# Patient Record
Sex: Male | Born: 1970 | Race: White | Hispanic: No | Marital: Single | Smoking: Never smoker
Health system: Southern US, Community
[De-identification: ages and names within clinical notes are randomized; demographics above are authoritative.]

## PROBLEM LIST (undated history)

## (undated) ENCOUNTER — Emergency Department: Payer: MEDICAID

## (undated) DIAGNOSIS — F1010 Alcohol abuse, uncomplicated: Secondary | ICD-10-CM

## (undated) DIAGNOSIS — I10 Essential (primary) hypertension: Secondary | ICD-10-CM

## (undated) DIAGNOSIS — F1020 Alcohol dependence, uncomplicated: Secondary | ICD-10-CM

## (undated) MED ORDER — CHLORDIAZEPOXIDE HCL 25 MG OR CAPS
100.0000 mg | ORAL_CAPSULE | ORAL | Status: AC | PRN
Start: 2020-10-17 — End: ?

## (undated) MED ORDER — CHLORDIAZEPOXIDE HCL 25 MG OR CAPS
50.0000 mg | ORAL_CAPSULE | ORAL | Status: AC | PRN
Start: 2020-10-17 — End: ?

---

## 1998-06-15 ENCOUNTER — Emergency Department (HOSPITAL_COMMUNITY): Admission: EM | Admit: 1998-06-15 | Discharge: 1998-06-15 | Payer: Self-pay | Admitting: Emergency Medicine

## 2005-03-15 ENCOUNTER — Emergency Department (HOSPITAL_COMMUNITY): Admission: EM | Admit: 2005-03-15 | Discharge: 2005-03-15 | Payer: Self-pay | Admitting: Emergency Medicine

## 2005-03-21 ENCOUNTER — Emergency Department (HOSPITAL_COMMUNITY): Admission: EM | Admit: 2005-03-21 | Discharge: 2005-03-21 | Payer: Self-pay | Admitting: Emergency Medicine

## 2005-06-29 ENCOUNTER — Emergency Department (HOSPITAL_COMMUNITY): Admission: EM | Admit: 2005-06-29 | Discharge: 2005-06-29 | Payer: Self-pay | Admitting: Emergency Medicine
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
## 2017-12-10 ENCOUNTER — Encounter (HOSPITAL_COMMUNITY): Payer: Self-pay | Admitting: Emergency Medicine

## 2017-12-10 ENCOUNTER — Other Ambulatory Visit: Payer: Self-pay

## 2017-12-10 ENCOUNTER — Emergency Department (HOSPITAL_COMMUNITY)
Admission: EM | Admit: 2017-12-10 | Discharge: 2017-12-10 | Disposition: A | Discharge status: Home / Self Care | Payer: Self-pay | Attending: Emergency Medicine | Admitting: Emergency Medicine

## 2017-12-10 DIAGNOSIS — F10920 Alcohol use, unspecified with intoxication, uncomplicated: Secondary | ICD-10-CM

## 2017-12-10 DIAGNOSIS — F1020 Alcohol dependence, uncomplicated: Secondary | ICD-10-CM | POA: Insufficient documentation

## 2017-12-10 LAB — CBC WITH DIFFERENTIAL/PLATELET
Basophils Absolute: 0.1 10*3/uL (ref 0.0–0.1)
Basophils Relative: 2 %
Eosinophils Absolute: 0.1 10*3/uL (ref 0.0–0.7)
Eosinophils Relative: 2 %
HCT: 42.4 % (ref 39.0–52.0)
Hemoglobin: 14.1 g/dL (ref 13.0–17.0)
LYMPHS ABS: 1.7 10*3/uL (ref 0.7–4.0)
LYMPHS PCT: 39 %
MCH: 33.4 pg (ref 26.0–34.0)
MCHC: 33.3 g/dL (ref 30.0–36.0)
MCV: 100.5 fL — ABNORMAL HIGH (ref 78.0–100.0)
Monocytes Absolute: 0.7 10*3/uL (ref 0.1–1.0)
Monocytes Relative: 17 %
Neutro Abs: 1.7 10*3/uL (ref 1.7–7.7)
Neutrophils Relative %: 40 %
Platelets: 238 10*3/uL (ref 150–400)
RBC: 4.22 MIL/uL (ref 4.22–5.81)
RDW: 16.7 % — ABNORMAL HIGH (ref 11.5–15.5)
WBC: 4.3 10*3/uL (ref 4.0–10.5)

## 2017-12-10 LAB — ACETAMINOPHEN LEVEL: Acetaminophen (Tylenol), Serum: <10 ug/mL — ABNORMAL LOW (ref 10–30)

## 2017-12-10 LAB — BASIC METABOLIC PANEL
Anion gap: 10 (ref 5–15)
BUN: 10 mg/dL (ref 6–20)
CO2: 24 mmol/L (ref 22–32)
Calcium: 8.4 mg/dL — ABNORMAL LOW (ref 8.9–10.3)
Chloride: 107 mmol/L (ref 98–111)
Creatinine, Ser: 1.14 mg/dL (ref 0.61–1.24)
GFR calc Af Amer: >60 mL/min (ref >60)
GFR calc non Af Amer: >60 mL/min (ref >60)
Glucose, Bld: 103 mg/dL — ABNORMAL HIGH (ref 70–99)
Potassium: 4.4 mmol/L (ref 3.5–5.1)
Sodium: 141 mmol/L (ref 135–145)

## 2017-12-10 LAB — ETHANOL: Alcohol, Ethyl (B): 261 mg/dL — ABNORMAL HIGH (ref <10)

## 2017-12-10 LAB — SALICYLATE LEVEL: Salicylate Lvl: <7 mg/dL (ref 2.8–30.0)

## 2017-12-10 MED ORDER — CHLORDIAZEPOXIDE HCL 25 MG PO CAPS: ORAL_CAPSULE | ORAL | 0 refills | Status: DC

## 2017-12-10 MED ORDER — LORAZEPAM 1 MG PO TABS
0.0000 mg | ORAL_TABLET | Freq: Four times a day (QID) | ORAL | Status: DC
  Filled 2017-12-10: qty 1

## 2017-12-10 MED ORDER — LORAZEPAM 2 MG/ML IJ SOLN: 0.0000 mg | Freq: Two times a day (BID) | INTRAMUSCULAR | Status: DC

## 2017-12-10 MED ORDER — LORAZEPAM 1 MG PO TABS: 0.0000 mg | ORAL_TABLET | Freq: Two times a day (BID) | ORAL | Status: DC

## 2017-12-10 MED ORDER — CHLORDIAZEPOXIDE HCL 25 MG PO CAPS
25.0000 mg | ORAL_CAPSULE | Freq: Once | ORAL | Status: AC
  Administered 2017-12-10: 25 mg via ORAL
  Filled 2017-12-10: qty 1

## 2017-12-10 MED ORDER — LORAZEPAM 2 MG/ML IJ SOLN
0.0000 mg | Freq: Four times a day (QID) | INTRAMUSCULAR | Status: DC
  Administered 2017-12-10: 1 mg via INTRAVENOUS

## 2017-12-10 MED ORDER — THIAMINE HCL 100 MG/ML IJ SOLN: 100.0000 mg | Freq: Every day | INTRAMUSCULAR | Status: DC

## 2017-12-10 MED ORDER — VITAMIN B-1 100 MG PO TABS
100.0000 mg | ORAL_TABLET | Freq: Every day | ORAL | Status: DC
  Administered 2017-12-10: 100 mg via ORAL
  Filled 2017-12-10: qty 1
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
## 2017-12-10 NOTE — B[facility] Counselor (Signed)
Clinician spoke to Dr. Eulis Foster and noted the pt was seeking detox. Clinician expressed that [facility] does not do detox, the pt has to have co-occurring disorders to be admitted. Clinician spoke to [place], [place] and noted she expressed to the pt that [facility] does not do detox. Clinician met with the pt and expressed that [facility] does not do detox and provided the pt a list of resources to follow up. The pt seemed upset as he was not getting the help he needed.  After talking with the pt, clinician touched basis with Dr. Eulis Foster and Ria Comment, RN.   Vertell Novak, MS, [facility], [facility] Triage Specialist [phone]

## 2017-12-10 NOTE — ED Triage Notes (Signed)
Pt states he is in need of detox. Pt reports he has not drank for 20 years then began drinking heavily over the last 3 months due to a breakup. Pt reports he has to get up in the middle of the night to drink to stop sweating and shaking.

## 2017-12-10 NOTE — Discharge Instructions (Addendum)
Follow-up for treatment in 1 of the facilities recommended tonight.  Avoid all forms of alcohol.  Use the medication prescribed, Librium, to help you avoid alcohol use.

## 2018-07-24 ENCOUNTER — Other Ambulatory Visit: Payer: Self-pay

## 2018-07-24 ENCOUNTER — Emergency Department (HOSPITAL_COMMUNITY)
Admission: EM | Admit: 2018-07-24 | Discharge: 2018-07-24 | Disposition: A | Discharge status: Home / Self Care | Payer: Medicaid - Out of State | Attending: Emergency Medicine | Admitting: Emergency Medicine

## 2018-07-24 DIAGNOSIS — F10920 Alcohol use, unspecified with intoxication, uncomplicated: Secondary | ICD-10-CM

## 2018-07-24 DIAGNOSIS — F10929 Alcohol use, unspecified with intoxication, unspecified: Secondary | ICD-10-CM | POA: Diagnosis present

## 2018-07-24 DIAGNOSIS — Z79899 Other long term (current) drug therapy: Secondary | ICD-10-CM | POA: Insufficient documentation

## 2018-07-24 DIAGNOSIS — Y909 Presence of alcohol in blood, level not specified: Secondary | ICD-10-CM | POA: Diagnosis not present

## 2018-07-24 LAB — CBC WITH DIFFERENTIAL/PLATELET
Abs Immature Granulocytes: 0.02 10*3/uL (ref 0.00–0.07)
Basophils Absolute: 0.1 10*3/uL (ref 0.0–0.1)
Basophils Relative: 1 %
Eosinophils Absolute: 0 10*3/uL (ref 0.0–0.5)
Eosinophils Relative: 1 %
HCT: 42.5 % (ref 39.0–52.0)
Hemoglobin: 14.1 g/dL (ref 13.0–17.0)
Immature Granulocytes: 1 %
Lymphocytes Relative: 42 %
Lymphs Abs: 1.7 10*3/uL (ref 0.7–4.0)
MCH: 34.3 pg — AB (ref 26.0–34.0)
MCHC: 33.2 g/dL (ref 30.0–36.0)
MCV: 103.4 fL — ABNORMAL HIGH (ref 80.0–100.0)
MONO ABS: 0.5 10*3/uL (ref 0.1–1.0)
MONOS PCT: 11 %
Neutro Abs: 1.8 10*3/uL (ref 1.7–7.7)
Neutrophils Relative %: 44 %
Platelets: 297 10*3/uL (ref 150–400)
RBC: 4.11 MIL/uL — ABNORMAL LOW (ref 4.22–5.81)
RDW: 14.4 % (ref 11.5–15.5)
WBC: 4.1 10*3/uL (ref 4.0–10.5)
nRBC: 0 % (ref 0.0–0.2)

## 2018-07-24 LAB — COMPREHENSIVE METABOLIC PANEL
ALT: 23 U/L (ref 0–44)
AST: 34 U/L (ref 15–41)
Albumin: 4.1 g/dL (ref 3.5–5.0)
Alkaline Phosphatase: 62 U/L (ref 38–126)
Anion gap: 10 (ref 5–15)
BUN: 7 mg/dL (ref 6–20)
CO2: 25 mmol/L (ref 22–32)
Calcium: 8.6 mg/dL — ABNORMAL LOW (ref 8.9–10.3)
Chloride: 111 mmol/L (ref 98–111)
Creatinine, Ser: 0.73 mg/dL (ref 0.61–1.24)
GFR calc Af Amer: >60 mL/min (ref >60)
GFR calc non Af Amer: >60 mL/min (ref >60)
GLUCOSE: 101 mg/dL — AB (ref 70–99)
Potassium: 3.7 mmol/L (ref 3.5–5.1)
SODIUM: 146 mmol/L — AB (ref 135–145)
Total Bilirubin: 0.4 mg/dL (ref 0.3–1.2)
Total Protein: 7.1 g/dL (ref 6.5–8.1)

## 2018-07-24 LAB — ETHANOL: Alcohol, Ethyl (B): 358 mg/dL (ref <10)

## 2018-07-24 NOTE — ED Notes (Signed)
Bed: [facility] Expected date:  Expected time:  Means of arrival:  Comments: 48 yo ETOH

## 2018-07-24 NOTE — ED Triage Notes (Signed)
Pt [place]. Pt reported "having a bad day, wanted to drink it off" Denies pain, n/v.

## 2018-07-24 NOTE — ED Notes (Signed)
Pt states that he is ready to go home.  
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
## 2018-07-24 NOTE — ED Notes (Signed)
Pt tearful upon assessment.  When asked why he was sad, pt only stated "my baby."

## 2018-07-24 NOTE — ED Notes (Signed)
Pt denies SI/HI at this time.  

## 2018-10-04 ENCOUNTER — Encounter (HOSPITAL_COMMUNITY): Payer: Self-pay

## 2018-10-04 ENCOUNTER — Emergency Department (HOSPITAL_COMMUNITY)
Admission: EM | Admit: 2018-10-04 | Discharge: 2018-10-04 | Disposition: A | Discharge status: Home / Self Care | Payer: Self-pay | Attending: Emergency Medicine | Admitting: Emergency Medicine

## 2018-10-04 ENCOUNTER — Other Ambulatory Visit: Payer: Self-pay

## 2018-10-04 DIAGNOSIS — F10220 Alcohol dependence with intoxication, uncomplicated: Secondary | ICD-10-CM | POA: Insufficient documentation

## 2018-10-04 DIAGNOSIS — F419 Anxiety disorder, unspecified: Secondary | ICD-10-CM | POA: Insufficient documentation

## 2018-10-04 LAB — COMPREHENSIVE METABOLIC PANEL
ALT: 27 U/L (ref 0–44)
AST: 61 U/L — ABNORMAL HIGH (ref 15–41)
Albumin: 3.7 g/dL (ref 3.5–5.0)
Alkaline Phosphatase: 67 U/L (ref 38–126)
Anion gap: 10 (ref 5–15)
BUN: 11 mg/dL (ref 6–20)
CO2: 24 mmol/L (ref 22–32)
Calcium: 8 mg/dL — ABNORMAL LOW (ref 8.9–10.3)
Chloride: 95 mmol/L — ABNORMAL LOW (ref 98–111)
Creatinine, Ser: 1.05 mg/dL (ref 0.61–1.24)
GFR calc Af Amer: >60 mL/min (ref >60)
GFR calc non Af Amer: >60 mL/min (ref >60)
Glucose, Bld: 117 mg/dL — ABNORMAL HIGH (ref 70–99)
Potassium: 4.3 mmol/L (ref 3.5–5.1)
Sodium: 129 mmol/L — ABNORMAL LOW (ref 135–145)
Total Bilirubin: 0.6 mg/dL (ref 0.3–1.2)
Total Protein: 6.7 g/dL (ref 6.5–8.1)

## 2018-10-04 LAB — CBC WITH DIFFERENTIAL/PLATELET
Abs Immature Granulocytes: 0.02 10*3/uL (ref 0.00–0.07)
Basophils Absolute: 0.1 10*3/uL (ref 0.0–0.1)
Basophils Relative: 1 %
Eosinophils Absolute: 0.1 10*3/uL (ref 0.0–0.5)
Eosinophils Relative: 1 %
HCT: 41.1 % (ref 39.0–52.0)
Hemoglobin: 13.6 g/dL (ref 13.0–17.0)
Immature Granulocytes: 0 %
Lymphocytes Relative: 30 %
Lymphs Abs: 1.5 10*3/uL (ref 0.7–4.0)
MCH: 33.3 pg (ref 26.0–34.0)
MCHC: 33.1 g/dL (ref 30.0–36.0)
MCV: 100.7 fL — ABNORMAL HIGH (ref 80.0–100.0)
Monocytes Absolute: 0.4 10*3/uL (ref 0.1–1.0)
Monocytes Relative: 9 %
Neutro Abs: 2.9 10*3/uL (ref 1.7–7.7)
Neutrophils Relative %: 59 %
Platelets: 196 10*3/uL (ref 150–400)
RBC: 4.08 MIL/uL — ABNORMAL LOW (ref 4.22–5.81)
RDW: 13.9 % (ref 11.5–15.5)
WBC: 5 10*3/uL (ref 4.0–10.5)
nRBC: 0 % (ref 0.0–0.2)

## 2018-10-04 LAB — RAPID URINE DRUG SCREEN, HOSP PERFORMED
Amphetamines: NOT DETECTED
Barbiturates: NOT DETECTED
Benzodiazepines: NOT DETECTED
Cocaine: NOT DETECTED
Opiates: NOT DETECTED
Tetrahydrocannabinol: NOT DETECTED

## 2018-10-04 LAB — ETHANOL: Alcohol, Ethyl (B): 175 mg/dL — ABNORMAL HIGH (ref <10)

## 2018-10-04 LAB — MAGNESIUM: Magnesium: 2 mg/dL (ref 1.7–2.4)

## 2018-10-04 MED ORDER — CHLORDIAZEPOXIDE HCL 25 MG PO CAPS: ORAL_CAPSULE | ORAL | 0 refills | Status: DC

## 2018-10-04 MED ORDER — VITAMIN B-1 100 MG PO TABS
100.0000 mg | ORAL_TABLET | Freq: Once | ORAL | Status: AC
  Administered 2018-10-04: 100 mg via ORAL
  Filled 2018-10-04: qty 1

## 2018-10-04 MED ORDER — LORAZEPAM 1 MG PO TABS
1.0000 mg | ORAL_TABLET | Freq: Once | ORAL | Status: AC
  Administered 2018-10-04: 1 mg via ORAL
  Filled 2018-10-04: qty 1

## 2018-10-04 NOTE — ED Provider Notes (Signed)
[facility]-EMERGENCY DEPT Provider Note   CSN: [id number] Arrival date & time: 10/04/18  1223    History   Chief Complaint Chief Complaint  Patient presents with  . Alcohol Problem    HPI Ivan Schmidt is a 48 y.o. male.     HPI Patient states he has been drinking roughly 18 beers daily.  Last drink this morning.  States he took a muscle relaxant this morning for what he believes were withdrawal symptoms.  He is very anxious and tremulous.  Complaining of paresthesias throughout.  He denies taking any other substances.  No nausea or vomiting.  No SI/HI. History reviewed. No pertinent past medical history.  There are no active problems to display for this patient.   History reviewed. No pertinent surgical history.      Home Medications    Prior to Admission medications   Medication Sig Start Date End Date Taking? Authorizing Provider  chlordiazePOXIDE (LIBRIUM) 25 MG capsule  PO TID x 1D, then 25-50mg  PO BID X 1D, then 25-50mg  PO QD X 1D 10/04/18   Loren Racer, MD    Family History No family history on file.  Social History Social History   Tobacco Use  . Smoking status: Never Smoker  . Smokeless tobacco: Never Used  Substance Use Topics  . Alcohol use: Yes    Comment: 18 beers/day  . Drug use: Never     Allergies   Patient has no known allergies.   Review of Systems Review of Systems  Constitutional: Positive for fatigue. Negative for chills and fever.  Eyes: Negative for visual disturbance.  Respiratory: Negative for cough and shortness of breath.   Cardiovascular: Negative for chest pain.  Gastrointestinal: Negative for abdominal pain, diarrhea, nausea and vomiting.  Genitourinary: Negative for dysuria.  Musculoskeletal: Negative for arthralgias, myalgias, neck pain and neck stiffness.  Skin: Negative for rash and wound.  Neurological: Positive for numbness. Negative for dizziness, syncope, weakness,  light-headedness and headaches.  Psychiatric/Behavioral: Negative for hallucinations and suicidal ideas. The patient is nervous/anxious.   All other systems reviewed and are negative.    Physical Exam Updated Vital Signs BP (!) 164/126 (BP Location: Left Arm)   Pulse 63   Temp 97.6 F (36.4 C) (Oral)   Resp 16   Ht  (1.803 m)   Wt 79.4 kg   SpO2 99%   BMI 24.41 kg/m   Physical Exam Vitals signs and nursing note reviewed.  Constitutional:      Appearance: Normal appearance. He is well-developed.  HENT:     Head: Normocephalic and atraumatic.     Comments: No intraoral lesions.    Nose: Nose normal.     Mouth/Throat:     Mouth: Mucous membranes are moist.  Eyes:     Extraocular Movements: Extraocular movements intact.     Conjunctiva/sclera: Conjunctivae normal.     Pupils: Pupils are equal, round, and reactive to light.  Neck:     Musculoskeletal: Normal range of motion and neck supple. No neck rigidity or muscular tenderness.     Comments: No meningismus Cardiovascular:     Rate and Rhythm: Normal rate and regular rhythm.     Heart sounds: No murmur. No friction rub. No gallop.   Pulmonary:     Effort: Pulmonary effort is normal. No respiratory distress.     Breath sounds: Normal breath sounds. No stridor. No wheezing, rhonchi or rales.  Chest:     Chest wall: No tenderness.  Abdominal:     General: Bowel sounds are normal.     Palpations: Abdomen is soft.     Tenderness: There is no abdominal tenderness. There is no guarding or rebound.  Musculoskeletal: Normal range of motion.        General: No swelling, tenderness, deformity or signs of injury.     Right lower leg: No edema.     Left lower leg: No edema.  Lymphadenopathy:     Cervical: No cervical adenopathy.  Skin:    General: Skin is warm and dry.     Findings: No erythema or rash.  Neurological:     General: No focal deficit present.     Mental Status: He is alert and oriented to person, place,  and time.     Comments: Patient has upper extremity tremors but appears to calm with reassurance.  5/5 motor in all extremities.  Sensation to light touch is intact.  Psychiatric:        Behavior: Behavior normal.     Comments: Very anxious appearing.  Does not appear to be responding to internal stimuli.      ED Treatments / Results  Labs (all labs ordered are listed, but only abnormal results are displayed) Labs Reviewed  CBC WITH DIFFERENTIAL/PLATELET - Abnormal; Notable for the following components:      Result Value   RBC 4.08 (*)    MCV 100.7 (*)    All other components within normal limits  COMPREHENSIVE METABOLIC PANEL - Abnormal; Notable for the following components:   Sodium 129 (*)    Chloride 95 (*)    Glucose, Bld 117 (*)    Calcium 8.0 (*)    AST 61 (*)    All other components within normal limits  ETHANOL - Abnormal; Notable for the following components:   Alcohol, Ethyl (B) 175 (*)    All other components within normal limits  RAPID URINE DRUG SCREEN, HOSP PERFORMED  MAGNESIUM    EKG EKG Interpretation  Date/Time:  "Sunday Oct 04 2018 12:36:47 EDT Ventricular Rate:  61 PR Interval:    QRS Duration: 93 QT Interval:  384 QTC Calculation: 387 R Axis:   -19 Text Interpretation:  Borderline left axis deviation RSR' in V1 or V2, probably normal variant Baseline wander in lead(s) V1 Confirmed by Roma Bondar ([id number]) on 10/04/2018 3:48:51 PM   Radiology No results found.  Procedures Procedures (including critical care time)  Medications Ordered in ED Medications  LORazepam (ATIVAN) tablet 1 mg (1 mg Oral Given 10/04/18 1258)  thiamine (VITAMIN B-1) tablet 100 mg (100 mg Oral Given 10/04/18 1259)     Initial Impression / Assessment and Plan / ED Course  I have reviewed the triage vital signs and the nursing notes.  Pertinent labs & imaging results that were available during my care of the patient were reviewed by me and considered in my medical  decision making (see chart for details).       No evidence of acute alcohol withdrawal.  Heart rate in the 60s.  Suspect symptoms are anxiety related.  Will give short course of Librium and outpatient resources.  Return precautions given.   Final Clinical Impressions(s) / ED Diagnoses   Final diagnoses:  Alcohol dependence with uncomplicated intoxication (HCC)  Anxiety    ED Discharge Orders         Ordered    chlordiazePOXIDE (LIBRIUM) 25 MG capsule     05" /24/20 1547  Loren RacerYelverton, Marieelena Bartko, MD 10/04/18 [phone]

## 2018-10-04 NOTE — ED Triage Notes (Addendum)
Pt has hx of alcoholism. Pt states that he took a muscle relaxer to "stop twitching". Pt states in the past he has "stopped breathing". Pt states last drink this morning was a beer to "feel better". Pt denies trying to harm himself/SI/HI. Pt states that he was drinking to help get over a break up.  Pt states he was planning to go to detox soon. Pt states he drinks at least an 18 pack a day. Pt also states that he can't breathe. Pt is speaking in full sentences and appears very anxious. Pt also states he has numbness throughout his body.,

## 2018-11-18 ENCOUNTER — Other Ambulatory Visit: Payer: Self-pay

## 2018-11-18 ENCOUNTER — Emergency Department (HOSPITAL_COMMUNITY)
Admission: EM | Admit: 2018-11-18 | Discharge: 2018-11-18 | Disposition: A | Discharge status: Home / Self Care | Payer: Medicaid - Out of State | Attending: Emergency Medicine | Admitting: Emergency Medicine

## 2018-11-18 ENCOUNTER — Emergency Department (HOSPITAL_COMMUNITY): Payer: Medicaid - Out of State

## 2018-11-18 ENCOUNTER — Encounter (HOSPITAL_COMMUNITY): Payer: Self-pay

## 2018-11-18 DIAGNOSIS — R0789 Other chest pain: Secondary | ICD-10-CM | POA: Insufficient documentation

## 2018-11-18 DIAGNOSIS — F329 Major depressive disorder, single episode, unspecified: Secondary | ICD-10-CM | POA: Diagnosis not present

## 2018-11-18 DIAGNOSIS — Z79899 Other long term (current) drug therapy: Secondary | ICD-10-CM | POA: Insufficient documentation

## 2018-11-18 DIAGNOSIS — F32A Depression, unspecified: Secondary | ICD-10-CM

## 2018-11-18 DIAGNOSIS — F1020 Alcohol dependence, uncomplicated: Secondary | ICD-10-CM | POA: Insufficient documentation

## 2018-11-18 DIAGNOSIS — Z008 Encounter for other general examination: Secondary | ICD-10-CM | POA: Insufficient documentation

## 2018-11-18 DIAGNOSIS — F1010 Alcohol abuse, uncomplicated: Secondary | ICD-10-CM | POA: Diagnosis not present

## 2018-11-18 DIAGNOSIS — E876 Hypokalemia: Secondary | ICD-10-CM | POA: Diagnosis not present

## 2018-11-18 DIAGNOSIS — R079 Chest pain, unspecified: Secondary | ICD-10-CM

## 2018-11-18 LAB — CBC WITH DIFFERENTIAL/PLATELET
Abs Immature Granulocytes: 0.01 10*3/uL (ref 0.00–0.07)
Basophils Absolute: 0 10*3/uL (ref 0.0–0.1)
Basophils Relative: 1 %
Eosinophils Absolute: 0 10*3/uL (ref 0.0–0.5)
Eosinophils Relative: 1 %
HCT: 41.6 % (ref 39.0–52.0)
Hemoglobin: 14.3 g/dL (ref 13.0–17.0)
Immature Granulocytes: 0 %
Lymphocytes Relative: 41 %
Lymphs Abs: 1.4 10*3/uL (ref 0.7–4.0)
MCH: 34.1 pg — ABNORMAL HIGH (ref 26.0–34.0)
MCHC: 34.4 g/dL (ref 30.0–36.0)
MCV: 99.3 fL (ref 80.0–100.0)
Monocytes Absolute: 0.4 10*3/uL (ref 0.1–1.0)
Monocytes Relative: 13 %
Neutro Abs: 1.4 10*3/uL — ABNORMAL LOW (ref 1.7–7.7)
Neutrophils Relative %: 44 %
Platelets: 127 10*3/uL — ABNORMAL LOW (ref 150–400)
RBC: 4.19 MIL/uL — ABNORMAL LOW (ref 4.22–5.81)
RDW: 13.8 % (ref 11.5–15.5)
WBC: 3.3 10*3/uL — ABNORMAL LOW (ref 4.0–10.5)
nRBC: 0 % (ref 0.0–0.2)

## 2018-11-18 LAB — TROPONIN I (HIGH SENSITIVITY)
Troponin I (High Sensitivity): 3 ng/L (ref <18)
Troponin I (High Sensitivity): 4 ng/L (ref <18)
Troponin I (High Sensitivity): 3 ng/L (ref <18)

## 2018-11-18 LAB — COMPREHENSIVE METABOLIC PANEL
ALT: 79 U/L — ABNORMAL HIGH (ref 0–44)
AST: 67 U/L — ABNORMAL HIGH (ref 15–41)
Albumin: 3.8 g/dL (ref 3.5–5.0)
Alkaline Phosphatase: 59 U/L (ref 38–126)
Anion gap: 18 — ABNORMAL HIGH (ref 5–15)
BUN: <5 mg/dL — ABNORMAL LOW (ref 6–20)
CO2: 21 mmol/L — ABNORMAL LOW (ref 22–32)
Calcium: 8.6 mg/dL — ABNORMAL LOW (ref 8.9–10.3)
Chloride: 92 mmol/L — ABNORMAL LOW (ref 98–111)
Creatinine, Ser: 0.83 mg/dL (ref 0.61–1.24)
GFR calc Af Amer: >60 mL/min (ref >60)
GFR calc non Af Amer: >60 mL/min (ref >60)
Glucose, Bld: 172 mg/dL — ABNORMAL HIGH (ref 70–99)
Potassium: 2.8 mmol/L — ABNORMAL LOW (ref 3.5–5.1)
Sodium: 131 mmol/L — ABNORMAL LOW (ref 135–145)
Total Bilirubin: 0.5 mg/dL (ref 0.3–1.2)
Total Protein: 6.8 g/dL (ref 6.5–8.1)

## 2018-11-18 LAB — MAGNESIUM: Magnesium: 1.8 mg/dL (ref 1.7–2.4)

## 2018-11-18 LAB — LIPASE, BLOOD: Lipase: 49 U/L (ref 11–51)

## 2018-11-18 MED ORDER — LACTATED RINGERS IV BOLUS
1000.0000 mL | Freq: Once | INTRAVENOUS | Status: AC
  Administered 2018-11-18: 1000 mL via INTRAVENOUS

## 2018-11-18 MED ORDER — LORAZEPAM 2 MG/ML IJ SOLN
1.0000 mg | Freq: Once | INTRAMUSCULAR | Status: AC
  Administered 2018-11-18: 1 mg via INTRAVENOUS
  Filled 2018-11-18: qty 1

## 2018-11-18 MED ORDER — LIDOCAINE VISCOUS HCL 2 % MT SOLN
15.0000 mL | Freq: Once | OROMUCOSAL | Status: DC
  Filled 2018-11-18 (×2): qty 15

## 2018-11-18 MED ORDER — POTASSIUM CHLORIDE CRYS ER 20 MEQ PO TBCR
40.0000 meq | EXTENDED_RELEASE_TABLET | Freq: Once | ORAL | Status: AC
  Administered 2018-11-18: 40 meq via ORAL
  Filled 2018-11-18: qty 2

## 2018-11-18 MED ORDER — SODIUM CHLORIDE 0.9 % IV BOLUS
1000.0000 mL | Freq: Once | INTRAVENOUS | Status: AC
  Administered 2018-11-18: 1000 mL via INTRAVENOUS

## 2018-11-18 MED ORDER — ONDANSETRON HCL 4 MG/2ML IJ SOLN
4.0000 mg | Freq: Once | INTRAMUSCULAR | Status: AC
  Administered 2018-11-18: 4 mg via INTRAVENOUS
  Filled 2018-11-18: qty 2

## 2018-11-18 MED ORDER — LORAZEPAM 1 MG PO TABS
1.0000 mg | ORAL_TABLET | Freq: Once | ORAL | Status: AC
  Administered 2018-11-18: 1 mg via ORAL
  Filled 2018-11-18: qty 1

## 2018-11-18 MED ORDER — DIAZEPAM 5 MG/ML IJ SOLN
2.5000 mg | Freq: Once | INTRAMUSCULAR | Status: AC
  Administered 2018-11-18: 2.5 mg via INTRAVENOUS
  Filled 2018-11-18: qty 2

## 2018-11-18 MED ORDER — POTASSIUM CHLORIDE ER 20 MEQ PO TBCR: 20.0000 meq | EXTENDED_RELEASE_TABLET | Freq: Every day | ORAL | 0 refills | Status: AC

## 2018-11-18 MED ORDER — ALUM & MAG HYDROXIDE-SIMETH 200-200-20 MG/5ML PO SUSP
30.0000 mL | Freq: Once | ORAL | Status: DC
  Filled 2018-11-18: qty 30

## 2018-11-18 MED ORDER — CHLORDIAZEPOXIDE HCL 25 MG PO CAPS: ORAL_CAPSULE | ORAL | 0 refills | Status: DC

## 2018-11-18 NOTE — ED Provider Notes (Signed)
I received pt in signout from Dr. Leonette Monarch. Briefly, he had presented with chest pain, N/V, shortness of breath in setting of alcohol binge, recent stressors including recent suicide of girlfriend. At time of signout, awaiting labowork including serial troponins and basic labs.  Labs show K 2.8, normal Cr, AG 18. Gave IVF bolus and oral K repletion. Gave ativan for tremors.  TTS evaluated pt and determined he does not meet inpatient criteria. Provided outpatient resources and librium taper.   Serial troponins negative. Symptoms sound related to alcohol binge.  Counseled patient on Librium taper, potassium supplementation, and supportive measures at home.  Provided with a dose of oral Ativan prior to discharge.  And no symptoms of severe alcohol withdrawal on physical exam prior to discharge.  I have extensively reviewed return precautions and he voiced understanding.   Little, Wenda Overland, MD 11/18/18 1105

## 2018-11-18 NOTE — ED Notes (Signed)
7.5 mg valium wasted in sharps with RN Lytle Michaels.
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
## 2018-11-18 NOTE — BH Assessment (Signed)
Tele Assessment Note   Patient Name: Ivan Schmidt MRN: [id number] Referring Physician: Evlyn Courier, MD Location of Patient: [facility] Location of Provider: [facility] Department  Ivan Schmidt is a 48 y.o. male who presented to [facility] on voluntary basis with complaint of pain after ingesting a significant amount of alcohol on 11/17/2018.  Pt reported that on 11/17/2018, he ingested four 24 oz beers and an unknown quantity of moonshine.  Pt reported that he has a history of alcohol use, that he was sober for 12 years, lost his sobriety earlier this year, was sober again for two months, and has been consuming up to 18 beers daily for the last two weeks.  The Pt reported that the trigger for his recent binge of alcohol use is the suicide of his girlfriend two weeks ago.  Per Pt, his girlfriend committed suicide due to the stress of the coronavirus.  Since her death, Pt has engaged in significant alcohol use.  In addition to alcohol use, Pt endorsed despondency, episodes of hearing music when he is intoxicated, and fleeting/passive suicidal ideation.  ''I would never do it -- I'm a [place].''  Pt denied homicidal ideation and self-injurious behavior.  Pt denied any current psychiatric care.  He expressed interest in outpatient substance use treatment services.  Pt lives in [place] with his parents.  He is employed as a Actuary.    During assessment, Pt presented as alert and oriented.  As the interview had to be conducted by phone (teleheatlh cart not functioning), eye contact, movement, grooming, etc. Could not be assessed.  Pt's speech was normal in rate, rhythm, and volume.  Thought processes were within normal range, and thought content was logical and goal-oriented.  There was no evidence of delusion.  Pt's memory and concentration were intact.  Insight and judgment were fair.  Impulse control was poor as evidenced by alcohol use.  Consulted with L. Marcello Moores, [place], who  determined that Pt does not meet inpatient criteria.  Diagnosis: Alcohol Use Disorder  Past Medical History: History reviewed. No pertinent past medical history.  History reviewed. No pertinent surgical history.  Family History: History reviewed. No pertinent family history.  Social History:  reports that he has never smoked. He has never used smokeless tobacco. He reports current alcohol use. He reports that he does not use drugs.  Additional Social History:  Alcohol / Drug Use History of alcohol / drug use?: Yes Substance #1 Name of Substance 1: Alcohol 1 - Amount (size/oz): 18 beers per day; 4 24 oz beers and moonshine 1 - Frequency: Daily 1 - Duration: Ongoing 1 - Last Use / Amount: 11/17/2018  CIWA: CIWA-Ar BP: (!) 151/108 Pulse Rate: 87 Nausea and Vomiting: 3 Tactile Disturbances: moderately severe hallucinations(Pt states he sees things moving like the wall and the curtains) Tremor: moderate, with patient's arms extended Auditory Disturbances: not present Visual Disturbances: not present Anxiety: mildly anxious Headache, Fullness in Head: moderate Agitation: normal activity Orientation and Clouding of Sensorium: oriented and can do serial additions COWS:    Allergies: No Known Allergies  Home Medications: (Not in a hospital admission)   OB/GYN Status:  No LMP for male patient.  General Assessment Data Location of Assessment: [facility] ED TTS Assessment: In system Is this a Tele or Face-to-Face Assessment?: Tele Assessment Is this an Initial Assessment or a Re-assessment for this encounter?: Initial Assessment Patient Accompanied by:: N/A Language Other than English: No Living Arrangements: Other (Comment) What gender do you identify as?:  Male Marital status: Single Living Arrangements: Parent Can pt return to current living arrangement?: Yes Admission Status: Voluntary Is patient capable of signing voluntary admission?: Yes Referral Source:  Self/Family/Friend Insurance type: OOS Medicaid     Crisis Care Plan Living Arrangements: Parent Name of Psychiatrist: None Name of Therapist: None  Education Status Is patient currently in school?: No Is the patient employed, unemployed or receiving disability?: Employed  Risk to self with the past 6 months Suicidal Ideation: No Has patient been a risk to self within the past 6 months prior to admission? : Yes Suicidal Intent: No Has patient had any suicidal intent within the past 6 months prior to admission? : No Is patient at risk for suicide?: No Suicidal Plan?: No Has patient had any suicidal plan within the past 6 months prior to admission? : No Access to Means: No What has been your use of drugs/alcohol within the last 12 months?: Alcohol Previous Attempts/Gestures: No Other Self Harm Risks: Significant alcoholuse Intentional Self Injurious Behavior: None Family Suicide History: Unknown Recent stressful life event(s): Loss (Comment)(G/F committed suicide) Persecutory voices/beliefs?: No Depression: Yes Depression Symptoms: Despondent Substance abuse history and/or treatment for substance abuse?: Yes Suicide prevention information given to non-admitted patients: Not applicable  Risk to Others within the past 6 months Homicidal Ideation: No Does patient have any lifetime risk of violence toward others beyond the six months prior to admission? : No Thoughts of Harm to Others: No Current Homicidal Intent: No Current Homicidal Plan: No Access to Homicidal Means: No History of harm to others?: No Assessment of Violence: None Noted Does patient have access to weapons?: No Criminal Charges Pending?: No Does patient have a court date: No Is patient on probation?: No  Psychosis Hallucinations: Auditory(Sometimes hears music when intoxicated)  Mental Status Report Appearance/Hygiene: (S) Unable to Assess Eye Contact: Unable to Assess Motor Activity: Unable to  assess Speech: Logical/coherent Level of Consciousness: Alert Mood: Sad Affect: Appropriate to circumstance Anxiety Level: None Thought Processes: Coherent, Relevant Judgement: Partial Orientation: Person, Place, Time, Situation Obsessive Compulsive Thoughts/Behaviors: None  Cognitive Functioning Concentration: Normal Memory: Recent Intact, Remote Intact Is patient IDD: No Insight: Fair Impulse Control: Poor Appetite: Good Have you had any weight changes? : No Change Sleep: No Change Vegetative Symptoms: None  ADLScreening [facility] Assessment Services) Patient's cognitive ability adequate to safely complete daily activities?: Yes Patient able to express need for assistance with ADLs?: Yes Independently performs ADLs?: Yes (appropriate for developmental age)  Prior Inpatient Therapy Prior Inpatient Therapy: No  Prior Outpatient Therapy Prior Outpatient Therapy: No Does patient have an ACCT team?: No Does patient have Intensive In-House Services?  : No Does patient have [facility] services? : No Does patient have P4CC services?: No  ADL Screening (condition at time of admission) Patient's cognitive ability adequate to safely complete daily activities?: Yes Is the patient deaf or have difficulty hearing?: No Does the patient have difficulty seeing, even when wearing glasses/contacts?: No Does the patient have difficulty concentrating, remembering, or making decisions?: No Patient able to express need for assistance with ADLs?: Yes Does the patient have difficulty dressing or bathing?: No Independently performs ADLs?: Yes (appropriate for developmental age) Weakness of Legs: None Weakness of Arms/Hands: None  Home Assistive Devices/Equipment Home Assistive Devices/Equipment: None  Therapy Consults (therapy consults require a physician order) PT Evaluation Needed: No OT Evalulation Needed: No SLP Evaluation Needed: No Abuse/Neglect Assessment (Assessment to be complete  while patient is alone) Abuse/Neglect Assessment Can Be Completed: Yes Physical  Abuse: Denies Verbal Abuse: Denies Sexual Abuse: Denies Exploitation of patient/patient's resources: Denies Values / Beliefs Cultural Requests During Hospitalization: None Spiritual Requests During Hospitalization: None Consults Spiritual Care Consult Needed: No Social Work Consult Needed: No Merchant navy officerAdvance Directives (For Healthcare) Does Patient Have a Medical Advance Directive?: No Would patient like information on creating a medical advance directive?: No - Patient declined          Disposition:  Disposition Initial Assessment Completed for this Encounter: Yes Disposition of Patient: Discharge(Per L. Maisie Fushomas, NP, Pt does not meet inpt criteria)  This service was provided via telemedicine using a 2-way, interactive audio and video technology.  Names of all persons participating in this telemedicine service and their role in this encounter. Name: R. Haire Role: Pt             Earline Mayotteugene T Simrin Vegh 11/18/2018 8:44 AM

## 2018-11-18 NOTE — ED Notes (Signed)
Patient verbalizes understanding of discharge instructions. Opportunity for questioning and answers were provided. Armband removed by staff, pt discharged from ED.  

## 2018-11-18 NOTE — ED Provider Notes (Signed)
[facility] Provider Note  CSN: [id number] Arrival date & time: 11/18/18 [id number]  Chief Complaint(s) Chest Pain  HPI Ivan Schmidt is a 48 y.o. male   The history is provided by the patient.  Chest Pain Pain location:  L chest Pain quality: pressure   Pain radiates to:  Does not radiate Pain severity:  Moderate Onset quality:  Gradual Duration:  5 hours Timing:  Constant Progression:  Waxing and waning Chronicity:  New Context comment:  Alcohol binge; drank moonshine.  Relieved by:  Nitroglycerin Worsened by:  Nothing Associated symptoms: nausea and vomiting   Associated symptoms: no cough, no diaphoresis, no fever and no shortness of breath   Risk factors: no diabetes mellitus and no smoking     Past Medical History History reviewed. No pertinent past medical history. There are no active problems to display for this patient.  Home Medication(s) Prior to Admission medications   Medication Sig Start Date End Date Taking? Authorizing Provider  chlordiazePOXIDE (LIBRIUM) 25 MG capsule 50mg  PO TID x 1D, then 25-50mg  PO BID X 1D, then 25-50mg  PO QD X 1D 10/04/18   Loren RacerYelverton, David, MD                                                                                                                                    Past Surgical History History reviewed. No pertinent surgical history. Family History History reviewed. No pertinent family history.  Social History Social History   Tobacco Use  . Smoking status: Never Smoker  . Smokeless tobacco: Never Used  Substance Use Topics  . Alcohol use: Yes    Comment: 18 beers/day  . Drug use: Never   Allergies Patient has no known allergies.  Review of Systems Review of Systems  Constitutional: Negative for diaphoresis and fever.  Respiratory: Negative for cough and shortness of breath.   Cardiovascular: Positive for chest pain.  Gastrointestinal: Positive for nausea and vomiting.    All other systems are reviewed and are negative for acute change except as noted in the HPI  Physical Exam Vital Signs  I have reviewed the triage vital signs BP (!) 152/92 (BP Location: Right Arm)   Pulse 93   Temp 98.4 F (36.9 C) (Oral)   Resp 20   SpO2 97%   Physical Exam Vitals signs reviewed.  Constitutional:      General: He is not in acute distress.    Appearance: He is well-developed. He is not diaphoretic.  HENT:     Head: Normocephalic and atraumatic.     Nose: Nose normal.  Eyes:     General: No scleral icterus.       Right eye: No discharge.        Left eye: No discharge.     Conjunctiva/sclera: Conjunctivae normal.     Pupils: Pupils are equal, round, and reactive to light.  Neck:  Musculoskeletal: Normal range of motion and neck supple.  Cardiovascular:     Rate and Rhythm: Normal rate and regular rhythm.     Heart sounds: No murmur. No friction rub. No gallop.   Pulmonary:     Effort: Pulmonary effort is normal. No respiratory distress.     Breath sounds: Normal breath sounds. No stridor. No rales.  Abdominal:     General: There is no distension.     Palpations: Abdomen is soft.     Tenderness: There is no abdominal tenderness.  Musculoskeletal:        General: No tenderness.  Skin:    General: Skin is warm and dry.     Findings: No erythema or rash.  Neurological:     Mental Status: He is alert and oriented to person, place, and time.     ED Results and Treatments Labs (all labs ordered are listed, but only abnormal results are displayed) Labs Reviewed - No data to display                                                                                                                       EKG  EKG Interpretation  Date/Time:  Wednesday November 18 2018 05:32:52 EDT Ventricular Rate:  92 PR Interval:    QRS Duration: 104 QT Interval:  355 QTC Calculation: 440 R Axis:   -30 Text Interpretation:  Sinus rhythm Left axis deviation Anteroseptal  infarct, age indeterminate No significant change since last tracing Confirmed by Drema Pryardama, Raye Slyter [phone] on 11/18/2018 5:45:58 AM      Radiology No results found.  Pertinent labs & imaging results that were available during my care of the patient were reviewed by me and considered in my medical decision making (see chart for details).  Medications Ordered in ED Medications - No data to display                                                                                                                                  Procedures Procedures  (including critical care time)  Medical Decision Making / ED Course I have reviewed the nursing notes for this encounter and the patient's prior records (if available in EHR or on provided paperwork).   Ivan AkinRobert Telfair was evaluated in Emergency Department on 11/18/2018 for the symptoms described in the history of present illness. He was evaluated in the context of the global COVID-19 pandemic, which  necessitated consideration that the patient might be at risk for infection with the SARS-CoV-2 virus that causes COVID-19. Institutional protocols and algorithms that pertain to the evaluation of patients at risk for COVID-19 are in a state of rapid change based on information released by regulatory bodies including the CDC and federal and state organizations. These policies and algorithms were followed during the patient's care in the ED.  Patient presents with atypical chest pain.  EKG without acute ischemic changes or evidence of pericarditis.  Pain likely related from recent alcohol binge.  Gastritis/esophagitis versus pancreatitis.  Screening labs obtained.  Given the significant improvement with nitroglycerin, will obtain cardiac markers to rule out ACS.  Heart score of 3.  Appropriate for delta troponin.  Chest x-ray without evidence suggestive of pneumonia, pneumothorax, pneumomediastinum.  No abnormal contour of the mediastinum to suggest dissection.  No evidence of acute injuries.  Presentation not classic for aortic dissection or esophageal perforation.  Given patient's alcoholism, peer support consult was placed.  TTS consultation was also ordered for depression.  Patient endorsed suicidal thoughts but is adamant that he will never hurt himself.  Denies any HI or AVH.  Patient care turned over to Dr Rex Kras at 0700. Patient case and results discussed in detail; please see their note for further ED managment.          Final Clinical Impression(s) / ED Diagnoses Final diagnoses:  Chest pain  Alcohol consumption binge drinking  Depression, unspecified depression type      This chart was dictated using voice recognition software.  Despite best efforts to proofread,  errors can occur which can change the documentation meaning.   Fatima Blank, MD 11/18/18 [phone]

## 2018-11-18 NOTE — ED Notes (Signed)
Pt speaking to behavioral health on the phone.

## 2018-11-18 NOTE — ED Notes (Signed)
7.5mg  valium wasted in [facility] with Pablo Ledger, RN

## 2019-02-04 ENCOUNTER — Encounter (HOSPITAL_COMMUNITY): Payer: Self-pay | Admitting: Emergency Medicine

## 2019-02-04 ENCOUNTER — Emergency Department (HOSPITAL_COMMUNITY)
Admission: EM | Admit: 2019-02-04 | Discharge: 2019-02-04 | Disposition: A | Discharge status: Home / Self Care | Payer: Medicaid - Out of State | Attending: Emergency Medicine | Admitting: Emergency Medicine

## 2019-02-04 ENCOUNTER — Other Ambulatory Visit: Payer: Self-pay

## 2019-02-04 DIAGNOSIS — Z5321 Procedure and treatment not carried out due to patient leaving prior to being seen by health care provider: Secondary | ICD-10-CM | POA: Insufficient documentation

## 2019-02-04 HISTORY — DX: Alcohol dependence, uncomplicated: F10.20

## 2019-02-04 LAB — COMPREHENSIVE METABOLIC PANEL
ALT: 41 U/L (ref 0–44)
AST: 51 U/L — ABNORMAL HIGH (ref 15–41)
Albumin: 4.2 g/dL (ref 3.5–5.0)
Alkaline Phosphatase: 59 U/L (ref 38–126)
Anion gap: 12 (ref 5–15)
BUN: 14 mg/dL (ref 6–20)
CO2: 24 mmol/L (ref 22–32)
Calcium: 8.8 mg/dL — ABNORMAL LOW (ref 8.9–10.3)
Chloride: 92 mmol/L — ABNORMAL LOW (ref 98–111)
Creatinine, Ser: 0.9 mg/dL (ref 0.61–1.24)
GFR calc Af Amer: >60 mL/min (ref >60)
GFR calc non Af Amer: >60 mL/min (ref >60)
Glucose, Bld: 88 mg/dL (ref 70–99)
Potassium: 3.9 mmol/L (ref 3.5–5.1)
Sodium: 128 mmol/L — ABNORMAL LOW (ref 135–145)
Total Bilirubin: 0.8 mg/dL (ref 0.3–1.2)
Total Protein: 7.1 g/dL (ref 6.5–8.1)

## 2019-02-04 LAB — ETHANOL: Alcohol, Ethyl (B): 25 mg/dL — ABNORMAL HIGH (ref <10)

## 2019-02-04 LAB — CBC
HCT: 38.1 % — ABNORMAL LOW (ref 39.0–52.0)
Hemoglobin: 12.8 g/dL — ABNORMAL LOW (ref 13.0–17.0)
MCH: 34 pg (ref 26.0–34.0)
MCHC: 33.6 g/dL (ref 30.0–36.0)
MCV: 101.3 fL — ABNORMAL HIGH (ref 80.0–100.0)
Platelets: 309 10*3/uL (ref 150–400)
RBC: 3.76 MIL/uL — ABNORMAL LOW (ref 4.22–5.81)
RDW: 13.9 % (ref 11.5–15.5)
WBC: 5.9 10*3/uL (ref 4.0–10.5)
nRBC: 0 % (ref 0.0–0.2)

## 2019-02-04 LAB — RAPID URINE DRUG SCREEN, HOSP PERFORMED
Amphetamines: NOT DETECTED
Barbiturates: NOT DETECTED
Benzodiazepines: NOT DETECTED
Cocaine: NOT DETECTED
Opiates: NOT DETECTED
Tetrahydrocannabinol: NOT DETECTED

## 2019-02-04 NOTE — ED Triage Notes (Signed)
PT presents with complaints of difficulty urinating, blurred vision, and cramping to entire body. Pt reports being an alcoholic with last drink at 5pm today.

## 2019-02-21 ENCOUNTER — Encounter (HOSPITAL_COMMUNITY): Payer: Self-pay | Admitting: Emergency Medicine

## 2019-02-21 ENCOUNTER — Other Ambulatory Visit: Payer: Self-pay

## 2019-02-21 ENCOUNTER — Emergency Department (HOSPITAL_COMMUNITY)
Admission: EM | Admit: 2019-02-21 | Discharge: 2019-02-21 | Disposition: A | Discharge status: Home / Self Care | Payer: Medicaid - Out of State | Attending: Emergency Medicine | Admitting: Emergency Medicine

## 2019-02-21 DIAGNOSIS — F1020 Alcohol dependence, uncomplicated: Secondary | ICD-10-CM | POA: Insufficient documentation

## 2019-02-21 DIAGNOSIS — Y907 Blood alcohol level of 200-239 mg/100 ml: Secondary | ICD-10-CM | POA: Insufficient documentation

## 2019-02-21 LAB — CBC WITH DIFFERENTIAL/PLATELET
Abs Immature Granulocytes: 0.01 10*3/uL (ref 0.00–0.07)
Basophils Absolute: 0.1 10*3/uL (ref 0.0–0.1)
Basophils Relative: 2 %
Eosinophils Absolute: 0.1 10*3/uL (ref 0.0–0.5)
Eosinophils Relative: 4 %
HCT: 44.6 % (ref 39.0–52.0)
Hemoglobin: 15.1 g/dL (ref 13.0–17.0)
Immature Granulocytes: 0 %
Lymphocytes Relative: 44 %
Lymphs Abs: 1.8 10*3/uL (ref 0.7–4.0)
MCH: 33.6 pg (ref 26.0–34.0)
MCHC: 33.9 g/dL (ref 30.0–36.0)
MCV: 99.3 fL (ref 80.0–100.0)
Monocytes Absolute: 0.6 10*3/uL (ref 0.1–1.0)
Monocytes Relative: 16 %
Neutro Abs: 1.4 10*3/uL — ABNORMAL LOW (ref 1.7–7.7)
Neutrophils Relative %: 34 %
Platelets: 258 10*3/uL (ref 150–400)
RBC: 4.49 MIL/uL (ref 4.22–5.81)
RDW: 13.2 % (ref 11.5–15.5)
WBC: 4 10*3/uL (ref 4.0–10.5)
nRBC: 0 % (ref 0.0–0.2)

## 2019-02-21 LAB — BASIC METABOLIC PANEL
Anion gap: 10 (ref 5–15)
BUN: 8 mg/dL (ref 6–20)
CO2: 28 mmol/L (ref 22–32)
Calcium: 9 mg/dL (ref 8.9–10.3)
Chloride: 91 mmol/L — ABNORMAL LOW (ref 98–111)
Creatinine, Ser: 0.78 mg/dL (ref 0.61–1.24)
GFR calc Af Amer: >60 mL/min (ref >60)
GFR calc non Af Amer: >60 mL/min (ref >60)
Glucose, Bld: 115 mg/dL — ABNORMAL HIGH (ref 70–99)
Potassium: 3.8 mmol/L (ref 3.5–5.1)
Sodium: 129 mmol/L — ABNORMAL LOW (ref 135–145)

## 2019-02-21 LAB — ETHANOL: Alcohol, Ethyl (B): 217 mg/dL — ABNORMAL HIGH (ref <10)

## 2019-02-21 MED ORDER — CHLORDIAZEPOXIDE HCL 25 MG PO CAPS
25.0000 mg | ORAL_CAPSULE | Freq: Once | ORAL | Status: AC
  Administered 2019-02-21: 25 mg via ORAL
  Filled 2019-02-21: qty 1

## 2019-02-21 MED ORDER — SODIUM CHLORIDE 0.9 % IV BOLUS
1000.0000 mL | Freq: Once | INTRAVENOUS | Status: AC
  Administered 2019-02-21: 1000 mL via INTRAVENOUS

## 2019-02-21 MED ORDER — CHLORDIAZEPOXIDE HCL 25 MG PO CAPS
50.0000 mg | ORAL_CAPSULE | Freq: Once | ORAL | Status: AC
  Administered 2019-02-21: 50 mg via ORAL
  Filled 2019-02-21: qty 2

## 2019-02-21 MED ORDER — CHLORDIAZEPOXIDE HCL 25 MG PO CAPS: ORAL_CAPSULE | ORAL | 0 refills | Status: AC

## 2019-02-21 NOTE — ED Notes (Signed)
Pt sleeping on arrival to room, no visible sweating.  Pt able to drink cup of water with no visible tremors but can be seen when ask to extend arms.  Pt drinking "electrolyte solution" at this time.  After leaving room pt called NT back to room to report he does have a HA now. VSS

## 2019-02-21 NOTE — ED Triage Notes (Addendum)
Pt presents c/o multiple seizures x 4 days d/t etoh abuse. Pt reports he can feel them coming so he seeks out more etoh, pt appears to have been outside in rain for extended period of time.  Pt denies new injury, reports drinking @ 15 seltzers, c/o "liver" pain since returning from [facility] celebrating his birthday and attending daughters funeral. Pt reporting hallucinations, pin/needles in feet and nausea, denies emesis.

## 2019-02-21 NOTE — ED Provider Notes (Signed)
Emergency Department Provider Note   I have reviewed the triage vital signs and the nursing notes.   HISTORY  Chief Complaint Seizures   HPI Ivan Schmidt is a 48 y.o. male who presents the emergency department today with tremors.  Thinks he has had a seizure because he drank too much.  However with the story sounds like he actually just fell and got some abrasions on his knees and his left anterior shin.  Patient able ambulate and range of motion is normal.  States he drinks multiple drinks a day including 30 seltzers today.  No known medical history.  He is trying to get into rehab currently.   No other associated or modifying symptoms.    Past Medical History:  Diagnosis Date  . Alcoholism ([place])     There are no active problems to display for this patient.   History reviewed. No pertinent surgical history.  Current Outpatient Rx  . Order #: [id number] Class: Normal  . Order #: [id number] Class: Print    Allergies Patient has no known allergies.  No family history on file.  Social History Social History   Tobacco Use  . Smoking status: Never Smoker  . Smokeless tobacco: Never Used  Substance Use Topics  . Alcohol use: Yes    Comment: 18 beers/day; 4 24 oz beers and moonshine  . Drug use: Never    Review of Systems  All other systems negative except as documented in the HPI. All pertinent positives and negatives as reviewed in the HPI. ____________________________________________   PHYSICAL EXAM:  VITAL SIGNS: Vitals:   02/21/19 0532 02/21/19 0537 02/21/19 0630 02/21/19 0652  BP:  (!) 152/113 (!) 137/97 (!) 135/101  Pulse:  66 72 67  Resp:  (!) 23 12   Temp:  97.9 F (36.6 C)    TempSrc:  Oral    SpO2:  98% 95% 100%  Weight: 81 kg     Height: 5\' 11"  (1.803 m)        Constitutional: Alert and oriented. Well appearing and in no acute distress. Eyes: Conjunctivae are normal. PERRL. EOMI. Head: Atraumatic. Nose: No congestion/rhinnorhea.  Mouth/Throat: Mucous membranes are moist.  Oropharynx non-erythematous. Neck: No stridor.  No meningeal signs.   Cardiovascular: Normal rate, regular rhythm. Good peripheral circulation. Grossly normal heart sounds.   Respiratory: Normal respiratory effort.  No retractions. Lungs CTAB. Gastrointestinal: Soft and nontender. No distention.  Musculoskeletal: No lower extremity tenderness nor edema. No gross deformities of extremities. Neurologic:  Normal speech and language. No gross focal neurologic deficits are appreciated.  Skin:  Skin is warm, dry and intact. No rash noted.  ____________________________________________   LABS (all labs ordered are listed, but only abnormal results are displayed)  Labs Reviewed  BASIC METABOLIC PANEL - Abnormal; Notable for the following components:      Result Value   Sodium 129 (*)    Chloride 91 (*)    Glucose, Bld 115 (*)    All other components within normal limits  CBC WITH DIFFERENTIAL/PLATELET - Abnormal; Notable for the following components:   Neutro Abs 1.4 (*)    All other components within normal limits  ETHANOL - Abnormal; Notable for the following components:   Alcohol, Ethyl (B) 217 (*)    All other components within normal limits  CBG MONITORING, ED   ____________________________________________  EKG   EKG Interpretation  Date/Time:  Sunday February 21 2019 05:44:42 EDT Ventricular Rate:  72 PR Interval:    QRS Duration: 104  QT Interval:  392 QTC Calculation: 426 R Axis:   -13 Text Interpretation:  Sinus rhythm ST elev, probable normal early repol pattern Partial missing lead(s): V1 No significant change since last tracing Confirmed by Marily Memos [phone]) on 02/21/2019 6:02:09 AM       ____________________________________________  INITIAL IMPRESSION / ASSESSMENT AND PLAN / ED COURSE  Patient thinks is having withdrawal tremor/seizures however his alcohol level still elevated and his blood pressure was normal his  heart rate is normal making alcohol withdrawal unlikely.  No evidence of DTs.  Patient will be started on Librium for symptomatic control he has already work on rehab options.  No indication for acute psychiatric consultation or hospital admission at this time.  Pertinent labs & imaging results that were available during my care of the patient were reviewed by me and considered in my medical decision making (see chart for details).  A medical screening exam was performed and I feel the patient has had an appropriate workup for their chief complaint at this time and likelihood of emergent condition existing is low. They have been counseled on decision, discharge, follow up and which symptoms necessitate immediate return to the emergency department. They or their family verbally stated understanding and agreement with plan and discharged in stable condition.   ____________________________________________  FINAL CLINICAL IMPRESSION(S) / ED DIAGNOSES  Final diagnoses:  Uncomplicated alcohol dependence (HCC)    MEDICATIONS GIVEN DURING THIS VISIT:  Medications  chlordiazePOXIDE (LIBRIUM) capsule 50 mg (has no administration in time range)  sodium chloride 0.9 % bolus 1,000 mL (1,000 mLs Intravenous New Bag/Given 02/21/19 2876)  chlordiazePOXIDE (LIBRIUM) capsule 25 mg (25 mg Oral Given 02/21/19 0628)     NEW OUTPATIENT MEDICATIONS STARTED DURING THIS VISIT:  New Prescriptions   CHLORDIAZEPOXIDE (LIBRIUM) 25 MG CAPSULE    50mg  PO TID x 2D, then 25-50mg  PO BID X 2D, then 25-50mg  PO QD X 1D    Note:  This note was prepared with assistance of Dragon voice recognition software. Occasional wrong-word or sound-a-like substitutions may have occurred due to the inherent limitations of voice recognition software.   , MD 02/22/19 [phone]

## 2020-10-06 ENCOUNTER — Emergency Department: Payer: No Typology Code available for payment source

## 2020-10-06 ENCOUNTER — Emergency Department
Admission: EM | Admit: 2020-10-06 | Discharge: 2020-10-06 | Disposition: A | Discharge status: Home / Self Care | Payer: No Typology Code available for payment source | Attending: Emergency Medicine | Admitting: Emergency Medicine

## 2020-10-06 DIAGNOSIS — F489 Nonpsychotic mental disorder, unspecified: Secondary | ICD-10-CM

## 2020-10-06 DIAGNOSIS — F10929 Alcohol use, unspecified with intoxication, unspecified: Secondary | ICD-10-CM

## 2020-10-06 DIAGNOSIS — M47812 Spondylosis without myelopathy or radiculopathy, cervical region: Secondary | ICD-10-CM

## 2020-10-06 DIAGNOSIS — R569 Unspecified convulsions: Secondary | ICD-10-CM

## 2020-10-06 DIAGNOSIS — F10239 Alcohol dependence with withdrawal, unspecified: Secondary | ICD-10-CM | POA: Insufficient documentation

## 2020-10-06 DIAGNOSIS — R45851 Suicidal ideations: Secondary | ICD-10-CM

## 2020-10-06 DIAGNOSIS — I10 Essential (primary) hypertension: Secondary | ICD-10-CM | POA: Insufficient documentation

## 2020-10-06 DIAGNOSIS — F10920 Alcohol use, unspecified with intoxication, uncomplicated: Secondary | ICD-10-CM

## 2020-10-06 DIAGNOSIS — Z01810 Encounter for preprocedural cardiovascular examination: Secondary | ICD-10-CM

## 2020-10-06 DIAGNOSIS — Z20822 Contact with and (suspected) exposure to covid-19: Secondary | ICD-10-CM | POA: Insufficient documentation

## 2020-10-06 DIAGNOSIS — Z5900 Homelessness unspecified: Secondary | ICD-10-CM | POA: Insufficient documentation

## 2020-10-06 HISTORY — DX: Essential (primary) hypertension: I10

## 2020-10-06 HISTORY — DX: Alcohol abuse, uncomplicated: F10.10

## 2020-10-06 LAB — URINALYSIS
Bilirubin, UA: NEGATIVE
Glucose, UA: NEGATIVE MG/DL
Ketones, UA: NEGATIVE MG/DL
Leukocyte Esterase, UA: NEGATIVE
Nitrite, UA: NEGATIVE
Protein, UA: NEGATIVE MG/DL
RBC, UA: 1 #/HPF (ref 0–3)
Specific Grav, UA: 1.004 (ref 1.003–1.030)
Urobilinogen, UA: <2 MG/DL (ref <2.0)
WBC, UA: 1 #/HPF (ref 0–5)
pH, UA: 5 (ref 5.0–8.0)

## 2020-10-06 LAB — CBC WITH DIFF, BLOOD
ANC automated: 4.5 10*3/uL (ref 2.0–8.1)
Basophils %: 0.8 %
Basophils Absolute: 0.1 10*3/uL (ref 0.0–0.2)
Eosinophils %: 0.8 %
Eosinophils Absolute: 0.1 10*3/uL (ref 0.0–0.5)
Hematocrit: 40.9 % (ref 39.5–50.0)
Hgb: 14.2 G/DL (ref 13.5–16.9)
Lymphocytes %: 24.9 %
Lymphocytes Absolute: 1.7 10*3/uL (ref 0.9–3.3)
MCH: 33.8 PG — ABNORMAL HIGH (ref 27.0–33.5)
MCHC: 34.6 G/DL (ref 32.0–35.5)
MCV: 97.6 FL — ABNORMAL HIGH (ref 81.5–97.0)
MPV: 7 FL — ABNORMAL LOW (ref 7.2–11.7)
Monocytes %: 8 %
Monocytes Absolute: 0.5 10*3/uL (ref 0.0–0.8)
Neutrophils % (A): 65.5 %
PLT Count: 245 10*3/uL (ref 150–400)
RBC: 4.19 10*6/uL — ABNORMAL LOW (ref 4.38–5.62)
RDW-CV: 20.4 % — ABNORMAL HIGH (ref 11.6–14.4)
White Bld Cell Count: 6.8 10*3/uL (ref 4.0–10.5)

## 2020-10-06 LAB — COMPREHENSIVE METABOLIC PANEL, BLOOD
ALT: 41 U/L (ref 7–52)
AST: 49 U/L — ABNORMAL HIGH (ref 13–39)
Albumin: 4.5 G/DL (ref 4.2–5.5)
Alk Phos: 100 U/L (ref 34–104)
BUN: 12 mg/dL (ref 7–25)
Bilirubin, Total: 0.4 mg/dL (ref 0.0–1.4)
CO2: 25 mmol/L (ref 21–31)
Calcium: 8.9 mg/dL (ref 8.6–10.3)
Chloride: 98 mmol/L (ref 98–107)
Creat: 0.8 mg/dL (ref 0.7–1.3)
Electrolyte Balance: 12 mmol/L (ref 2–12)
Glucose: 125 mg/dL — ABNORMAL HIGH (ref 70–115)
Potassium: 3.5 mmol/L (ref 3.5–5.1)
Protein, Total: 7.8 G/DL (ref 6.0–8.3)
Sodium: 135 mmol/L — ABNORMAL LOW (ref 136–145)
eGFR - high estimate: >60 (ref >59)
eGFR - low estimate: >60 (ref >59)

## 2020-10-06 LAB — DRUG SCREEN RAPID PANEL 10 NO CONFIRMATION, URINE
Amphetamines, Urine: NOT DETECTED
Barbiturates: NOT DETECTED
Benzodiazepines: NOT DETECTED
Cocaine: NOT DETECTED
Fentanyl,  U Scrn: NOT DETECTED
MDMA: NOT DETECTED
Methadone: NOT DETECTED
Opiates: NOT DETECTED
Oxycodone Lvl, UR: NOT DETECTED
PCP: NOT DETECTED
Propoxyphene: NOT DETECTED
THC: NOT DETECTED

## 2020-10-06 LAB — LACTATE, BLOOD: Lactic Acid: 2 mmol/L (ref 0.5–2.0)

## 2020-10-06 LAB — BETA-HYDROXYBUTYRATE: Beta Hydroxybutyrate: 0.14 mmol/L (ref 0.02–0.27)

## 2020-10-06 LAB — PT/INR/PTT
INR: 1.02 (ref 0.90–1.10)
PTT: 29.2 s (ref 24.3–34.9)
Prothrombin Time: 13 s (ref 11.8–13.8)

## 2020-10-06 LAB — COVID-19, FLU A/B PANEL (POC)
COVID-19 Result: NOT DETECTED
Influenza A, PCR: NOT DETECTED
Influenza B, PCR: NOT DETECTED
Respiratory Virus Comment: NOT DETECTED

## 2020-10-06 LAB — PHOSPHORUS, BLOOD: Phosphorus: 3.7 MG/DL (ref 2.5–5.0)

## 2020-10-06 LAB — TROPONIN I, HIGH SENSITIVITY: Troponin I, High Sensitivity: 5 ng/L (ref 0–20)

## 2020-10-06 LAB — MAGNESIUM, BLOOD: Magnesium: 2.1 mg/dL (ref 1.9–2.7)

## 2020-10-06 MED ORDER — CHLORDIAZEPOXIDE HCL 25 MG OR CAPS
100.0000 mg | ORAL_CAPSULE | ORAL | Status: DC | PRN
Start: 2020-10-06 — End: 2020-10-06

## 2020-10-06 MED ORDER — THIAMINE HCL 100 MG OR TABS (CUSTOM)
100.0000 mg | ORAL_TABLET | Freq: Every day | ORAL | Status: DC
Start: 2020-10-06 — End: 2020-10-06
  Administered 2020-10-06: 100 mg via ORAL
  Filled 2020-10-06: qty 1

## 2020-10-06 MED ORDER — FOLIC ACID 1 MG OR TABS
1.0000 mg | ORAL_TABLET | Freq: Every day | ORAL | Status: DC
Start: 2020-10-06 — End: 2020-10-06
  Administered 2020-10-06 (×2): 1 mg via ORAL
  Filled 2020-10-06: qty 1

## 2020-10-06 MED ORDER — CHLORDIAZEPOXIDE HCL 25 MG OR CAPS
50.0000 mg | ORAL_CAPSULE | ORAL | Status: DC | PRN
Start: 2020-10-06 — End: 2020-10-06
  Administered 2020-10-06: 50 mg via ORAL
  Filled 2020-10-06 (×2): qty 2

## 2020-10-06 MED ORDER — TAB-A-VITE/BETA CAROTENE PO TABS
1.0000 | ORAL_TABLET | Freq: Every day | ORAL | Status: DC
Start: 2020-10-06 — End: 2020-10-06
  Administered 2020-10-06 (×2): 1 via ORAL
  Filled 2020-10-06: qty 1

## 2020-10-06 NOTE — ED Notes (Signed)
Pt to CT accompanied by [facility] and CT tech
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
## 2020-10-06 NOTE — ED EKG Interpretation (Signed)
ED EKG Interpretation    EKG: Normal Sinus Rhythm with Normal Axis and no ischemic changes.

## 2020-10-06 NOTE — Interdisciplinary (Signed)
Appointments    Corey [name] has an scheduled appointment with [facility]. Here is the information:    Clinic: [facility]    Appointment Date/Time: Tuesday May 31st 2022 at 2:00 pm    Address: [address] Corey [name] [place] [address]    Phone Number: [phone]    Contact Person: Samella Parr

## 2020-10-06 NOTE — ED Notes (Addendum)
Pt observed on gurney in position of comfort, visibly intoxicated with slurred speech, unable to obtain full orientation level d/t intoxication. Reporting SI, denies plan states "I just feel like I'm worthless", reports prior to 2 years ago was sober for 27 years, wants to get help. Pt denies HI/AH/VH. Denies CP, SOB, abd pain, N/V, and other complaints, placed on cardiac monitor. Pt in NAD

## 2020-10-06 NOTE — ED Notes (Signed)
Psych @ bedside for evaluation
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
## 2020-10-06 NOTE — Interdisciplinary (Signed)
10/06/20 1047   Assessment   Assessment Type Progress/Follow-up;Face to Face   Referral Information   Referral Type Discharge Planning   CSW reviewed chart and pt continues to need inpt treatment.  CSW made the following placement attempts:    1. [facility] (Tel: [phone]; Fax: [phone]); s/w Marylu Lund, and she does not have packet so csw refaxed packet  2. [facility] (Tel: [phone]; Fax: [phone]); s/w Lyman Bishop declined pt due to primary disorder being substance abuse  3. [facility] (Tel: [phone]; Fax: [phone]); s/w Donata Clay, and pt is on the list

## 2020-10-06 NOTE — Progress Notes (Signed)
Department of Psychiatry and Human Behavior  Consult and Liaison Discharge Note    Current Hospital Stay:   0 days - Admitted on: 10/06/2020  LPS/Legal Status:   Legal Status/Legal Hold   Procedures   . Voluntary but Holdable        Subjective  No acute events. Per nursing, patient has been sleeping since arrival to ED. Received Librium for alcohol withdrawal.     On interview patient reports that he is no longer intoxicated. He denies SI stating "I would never do that". He is future oriented, discusses plans to "get back on track". He also expresses concern that he may lose access to [facility] resources if he does not check in with his case manager today. He reports that his plan is to leave the hospital and make it to [facility] before it closes to ensure his housing. He feels safe leaving the hospital. He reports he will return to the hospital should he feel unsafe in the future.     ROS: endorses tremor from withdrawal    [place] Suicide Risk Severity Rate Scale (C-SSRS):     1. While you were in the hospital, have you wished you were dead or wished you could go to sleep and not wake up?:  Yes  2. While you were in the hospital, have you actually had any thoughts of killing yourself?:  No  6. While you were here in the hospital, have you ever done anything, started to do anything or prepared to do anything to end your life?:  No    CSSRS Risk Level:  Minimal Risk    Objective  Vital Signs:  Temperature:  [98.1 F (36.7 C)] 98.1 F (36.7 C) (05/27 0244)  Blood pressure (BP): (113-164)/(73-98) 113/73 (05/27 1052)  Heart Rate:  [78-94] 89 (05/27 1052)  Respirations:  [16-18] 18 (05/27 1052)  Pain Score: 8 (05/27 0244)  O2 Device: None (Room air) (05/27 1052)  SpO2:  [93 %-100 %] 96 % (05/27 1052)                          Mental Status Exam  Physical appearance: fair hygiene   Oriented to Person: Yes    Oriented to Place: Yes    Oriented to Time: Yes    Eye contact:  Fair  Attitude:  Cooperative, engaged,  reasonable   Motor behavior:  Normal  Affect (clinician's impression):  Congruent, euthymic   Mood (by patient /report):  "I want to get sober"  Speech quality: normal rate, normal volume  Thought Process: linear   Thought content: no evidence of psychosis   Judgement:  Moderately impaired though improving   Insight:  Fair   Suicide:  Denies SI   Homicide: Denies HI     Current Medications:  . folic acid  1 mg Daily   . multivitamin adult  1 tablet Daily   . thiamine  100 mg Daily     . chlordiazePOXIDE  50 mg Q1H PRN 50 mg at 10/06/20 1033    Or   . chlordiazePOXIDE  100 mg Q1H PRN         Laboratory data:   Recent Results (from the past 24 hour(s))   ECG 12 Lead    Collection Time: 10/06/20  3:12 AM   Result Value Ref Range    ECG INTERPRETATION SINUS RHYTHM NORMAL ECG  UNCONFIRMED REPORT     VENTRICULAR RATE 83 BPM  PR INTERVAL 172 ms    QRS INTERVAL/DURATION 99 ms    QT 358 ms    QTC INTERVAL 398 ms    P AXIS 50 Deg    R AXIS -16 Deg    T AXIS 64 Deg    R-R INTERVAL AVERAGE 718 ms   Urinalysis    Collection Time: 10/06/20  3:19 AM   Result Value Ref Range    UR Sample Site, UA URINE,TYPE NOT SPECIFIED     Color, UA YELLOW     Clarity CLEAR     Specific Grav, UA 1.004 1.003 - 1.030    pH, UA 5 5.0 - 8.0    Protein, UA NEGATIVE NEGATIVE MG/DL    Glucose, UA NEGATIVE NEGATIVE MG/DL    Ketones, UA NEGATIVE NEGATIVE MG/DL    Bilirubin, UA NEGATIVE NEGATIVE    Hemoglobin, UA SMALL (A) NEGATIVE    Leukocyte Esterase, UA NEGATIVE NEGATIVE    Nitrite, UA NEGATIVE NEGATIVE    Urobilinogen, UA <2 <2.0 MG/DL    RBC, UA 1 0 - 3 #/HPF    WBC, UA 1 0 - 5 #/HPF    WBC Clumps, UA NONE NONE #/HPF    Bacteria, UA NONE NONE    Squamous Epithelial, UA NONE 0 - 5 /HPF    Mucous, UA FEW (A) NONE /LPF   Drug Screen Rapid Panel 10 No Confirmation, Urine    Collection Time: 10/06/20  3:19 AM   Result Value Ref Range    Drug Screen Type       Screening results are presumptive and should be interpreted with caution in conjunction with  other clinical information.    Opiates UNDETECTED UNDETECTED    Cocaine UNDETECTED UNDETECTED    PCP UNDETECTED UNDETECTED    Amphetamines, Urine UNDETECTED UNDETECTED    THC UNDETECTED UNDETECTED    Methadone UNDETECTED UNDETECTED    Propoxyphene UNDETECTED UNDETECTED    Benzodiazepines UNDETECTED UNDETECTED    Barbiturates UNDETECTED UNDETECTED    MDMA UNDETECTED UNDETECTED    Oxycodone Lvl, UR UNDETECTED UNDETECTED    Fentanyl,  U Scrn UNDETECTED UNDETECTED    Drug Screen Cutoff Concentration Drugs Covered and Cutoff Concentration:    COVID-19, Flu A/B Panel (POC)    Collection Time: 10/06/20  3:54 AM    Specimen: Nasal-Pharyngeal; Swab   Result Value Ref Range    Respiratory Virus Source SWAB     COVID-19 Result NOT DETECTED     Influenza A, PCR NOT DETECTED     Influenza B, PCR NOT DETECTED     Respiratory Virus Comment Reference Range: Not Detected    CBC w/ Diff Lavender    Collection Time: 10/06/20  3:54 AM   Result Value Ref Range    White Bld Cell Count 6.8 4.0 - 10.5 THOUS/MCL    RBC 4.19 (L) 4.38 - 5.62 MILL/MCL    Hgb 14.2 13.5 - 16.9 G/DL    Hematocrit 40.9 39.5 - 50.0 %    MCV 97.6 (H) 81.5 - 97.0 FL    MCH 33.8 (H) 27.0 - 33.5 PG    MCHC 34.6 32.0 - 35.5 G/DL    RDW-CV 20.4 (H) 11.6 - 14.4 %    PLT Count 245 150 - 400 THOUS/MCL    MPV 7.0 (L) 7.2 - 11.7 FL    Diff Type DIFFERENTIAL PERFORMED BY AUTOMATED ANALYSIS     Neutrophils % (A) 65.5 %    ANC automated 4.5 2.0 - 8.1 THOUS/MCL  Lymphocytes % 24.9 %    Lymphocytes Absolute 1.7 0.9 - 3.3 THOUS/MCL    Monocytes % 8.0 %    Monocytes Absolute 0.5 0.0 - 0.8 THOUS/MCL    Eosinophils % 0.8 %    Eosinophils Absolute 0.1 0.0 - 0.5 THOUS/MCL    Basophils % 0.8 %    Basophils Absolute 0.1 0.0 - 0.2 THOUS/MCL   PT/INR/PTT    Collection Time: 10/06/20  3:54 AM   Result Value Ref Range    Prothrombin Time 13.0 11.8 - 13.8 SEC    INR 1.02 0.90 - 1.10    PTT 29.2 24.3 - 34.9 SEC   Comprehensive Metabolic Panel    Collection Time: 10/06/20  3:54 AM   Result  Value Ref Range    Sodium 135 (L) 136 - 145 mmol/L    Potassium 3.5 3.5 - 5.1 mmol/L    Chloride 98 98 - 107 mmol/L    CO2 25 21 - 31 mmol/L    Electrolyte Balance 12 2 - 12 mmol/L    Glucose 125 (H) 70 - 115 mg/dL    BUN 12 7 - 25 mg/dL    Creat 0.8 0.7 - 1.3 mg/dL    eGFR - low estimate >60 >59    eGFR - high estimate >60 >59    Calcium 8.9 8.6 - 10.3 mg/dL    Protein, Total 7.8 6.0 - 8.3 G/DL    Albumin 4.5 4.2 - 5.5 G/DL    Alk Phos 100 34 - 104 U/L    AST 49 (H) 13 - 39 U/L    ALT 41 7 - 52 U/L    Bilirubin, Total 0.4 0.0 - 1.4 mg/dL   Troponin I, High Sensitivity Light Green    Collection Time: 10/06/20  3:54 AM   Result Value Ref Range    Troponin I, High Sensitivity 5 0 - 20 ng/L   Lactate, Blood - See Instructions    Collection Time: 10/06/20  3:54 AM   Result Value Ref Range    Lactic Acid 2.0 0.5 - 2.0 mmol/L   Beta-Hydroxybutyrate PST Light Green    Collection Time: 10/06/20  3:54 AM   Result Value Ref Range    Beta Hydroxybutyrate 0.14 0.02 - 0.27 mmol/L   Magnesium, Blood Green Plasma Separator Tube    Collection Time: 10/06/20  3:54 AM   Result Value Ref Range    Magnesium 2.1 1.9 - 2.7 mg/dL   Phosphorus, Blood Green Plasma Separator Tube    Collection Time: 10/06/20  3:54 AM   Result Value Ref Range    Phosphorus 3.7 2.5 - 5.0 MG/DL       ASSESSMENT AND PLAN:  Corey [name] is a 50 year old male with 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 UDS neg. On interview patient no longer intoxicated, denies SI. He is future oriented and reasonable, stating plan to report to [facility] to secure housing today before the weekend. Initial presentation concerning for alcohol intoxication, patient now sober and feels safe discharging to [facility]. He has support of his wife and [facility] case Freight forwarder. Given above, patient no longer DTS and does not  meet criteria for psychiatric inpatient hospitalization.     Active Hospital Problems    Diagnosis   . Alcohol intoxication (CMS-HCC) [F10.929]       Recommendations:  1.  Patient does not meet criteria for inpatient psychiatric hospitalization on the basis of DTS, DTO or GD.   Marland Kitchen  LPS status: Voluntary. No longer meets criteria for psychiatric hospitalization.   2. The following discharge plan was discussed with the patient:    . Housing: Psychologist, counselling, sober living  . Will provide prescription(s) the following medications:   o None indicated at this time   . Obtained collateral from: SW reached out to [facility]  . Follow-Up care: [facility], pt plans to go to [facility] to ensure his housing  . Safety Precautions: Recommended abstaining from alcohol and recreational drugs.   . See MHA/SW notes for additional resources provided to patient as indicated     3. Emergency department return precautions were discussed with Herbie Baltimore who expressed understanding that he should return to the nearest emergency department if he develops or experiences worsening thoughts of self-harm, suicidal or homicidal ideation, or psychosis, or if feeling unable to keep themselves safe.      Mr. Liddy was seen and evaluated by attending psychiatrist, Dr. Judene Companion, who agrees with the above assessment and recommendations. Thank you for this interesting consultation and for including [place] in the multidisciplinary care of Mr. Marcum. Please page [place] at [phone] for any further or new questions.    Tiffany Kocher MD PGY-2

## 2020-10-06 NOTE — Discharge Instructions (Addendum)
ED Visit Summary:  You have been evaluated at [facility] Emergency Department today.    Based on your workup here, you were deemed stable for discharge. Your lab and/or imaging results are included in this packet.    New Medications Prescribed:  None    Over-the-counter medications for pain:  -Tylenol up to 1,000 mg every 8 hours as needed  -Motrin or Advil up to 800 mg every 8 hours as needed    Continue your home medications as previously prescribed.    Referrals Placed:  None    Follow Up:  Please follow-up with the above services, and your primary care doctor in 3-5 days as needed.    Return to the emergency department immediately for thoughts of wanting to hurt yourself or any new or worsening symptoms.    Pending Tests:  If you have any outstanding lab or imaging studies at the time of your discharge, please call us here: [phone].

## 2020-10-06 NOTE — Interdisciplinary (Signed)
Referral Numbers    The following phone numbers may be helpful to get you through a tough time and prevent a tragedy.  Please utilize them should you experience worsening symptoms or the onset of thoughts of death.    24 Hour Crisis lines for those feeling suicidal:  National Suicide Prevention Hotlines:  1-800 273-TALK 1-800 273-8255  1-800 SUICIDE / 1-800 784-2433  1-800 442-HOPE / 1-800 442-4673    [place] Crisis Prevention Hotline:  1-877 7CRISIS / 1-877 727-4747    For emergency psychiatric evaluation 24 hours a day, 7 days a week that responds to private residences: 1-866 830-6011   Seeking information and referral to behavioral health services (non-emergency): 1-855 OC-Links / 1-855 625-4657    [place] National Alliance for Mental Illness Warm Line to talk to someone, (non-emergency): 1-877 910-WARM / 1-877 910-9276  -[facility] 24/7 NATIONAL MENTAL HEALTH HOTLINE (800) 854-7771   -Alcoholics Anonymous 24 Hour Help Line (714) 556-4555   -Narcotics Anonymous 24-Hour Help Line (714) 590-2388.    -National Domestic Violence Hotline: 1-800 799-SAFE / 1-800 799-7233  -Domestic Violence Safety Plan Hotline: 1-800 978-3600    -[place] Referral Line: 1-855-625-4657, Call to be put on a wait list for substance use treatment programs that accept Medi-Cal in [place]. If you are already on the wait list, call to update your information regarding discharge from the hospital and contact info. Ask about requirements for securing placement once a bed is ready for you.   -Seeking to refer a loved one to the Assisted Outpatient Treatment program of [place]: 1-855 HCA-1421 / 1-855 422-1421    Homeless Resources:  -[facility], [address], [place], [place], [address]. [phone] 6month program  -[facility], [address], [place], [address]. (714) 542-9576 or (714) 832-7100. Line up at 2:30 for bed  assignment.    -[facility], call (714) 547 7559      [facility] Appointment Line: [phone]

## 2020-10-06 NOTE — Interdisciplinary (Signed)
Social Work Assessment, Adult        Patient Name:  Corey [name]   MRN: [id number]   Date of Birth: Aug 08, 1970    Age: 50 year old   Date of Admission:  10/06/2020         Service Date: Oct 06, 2020     Assessment  Assessment Type: Initial    Referral Information  Referral Type: Mental Health Assessment   Social Assessment  Where was the patient admitted from? *: Homeless  Prior to Level of Function *: Ambulatory/Independent with ADL's  Assistive Device *: Not applicable  Primary Caretaker(s) *: Self  Is Patient Minor?: No  Interpreter Used?: Not Needed     Social Determinates of Health  Living Arrangements *: Homeless  Post Acute Services Referred To: Inpatient Psych  Type of Residence *: Homeless  Home Care Services *: No  Family/Caregiver's Assessed for *: Not Applicable  Respite Care *: Not Applicable    Income Information  Income Source: Unemployed  Financial Resources: Medi-Cal    Mental Health Assessment  Past Mental Health Issues: Per pt, manic depressive and schizophrenia    Substance Abuse History (CAGE-AID)  Substance Abuse History: Currently intoxicated per psych - endorses 15 glasses of wine daily         Plans/Interventions/Discharge  Barriers to Discharge *: Clinical reason     Pt is a 50 year old male with 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 UDS negative. Pt with a history of SA, most recent being last month. Psych recommends inpatient psychiatry on the basis of DTS (voluntary). Per AN3, no beds available at [facility]. Writer to begin bed search at the following facilities:       1. [facility] (Tel: [phone]; Fax: [phone]); s/w Clydene Fake, packet sent.  2. [facility] (Tel: [phone]; Fax: [phone]); s/w Tania, packet sent.  3. [facility] (Tel: [phone]; Fax: [phone]); s/w Jenne Campus, packet sent.    Plan: Pt remains in  ED pending placement.     Carroll Kinds, LCSW     Date: 10/06/2020    Time: 5:22 AM

## 2020-10-07 ENCOUNTER — Emergency Department: Payer: No Typology Code available for payment source

## 2020-10-07 ENCOUNTER — Emergency Department
Admission: EM | Admit: 2020-10-07 | Discharge: 2020-10-07 | Disposition: A | Discharge status: Home / Self Care | Payer: No Typology Code available for payment source | Attending: Emergency Medicine | Admitting: Emergency Medicine

## 2020-10-07 DIAGNOSIS — Z01810 Encounter for preprocedural cardiovascular examination: Secondary | ICD-10-CM

## 2020-10-07 DIAGNOSIS — R079 Chest pain, unspecified: Secondary | ICD-10-CM | POA: Insufficient documentation

## 2020-10-07 DIAGNOSIS — F10230 Alcohol dependence with withdrawal, uncomplicated: Secondary | ICD-10-CM | POA: Insufficient documentation

## 2020-10-07 DIAGNOSIS — Z20822 Contact with and (suspected) exposure to covid-19: Secondary | ICD-10-CM | POA: Insufficient documentation

## 2020-10-07 DIAGNOSIS — Z8249 Family history of ischemic heart disease and other diseases of the circulatory system: Secondary | ICD-10-CM | POA: Insufficient documentation

## 2020-10-07 DIAGNOSIS — R0789 Other chest pain: Secondary | ICD-10-CM

## 2020-10-07 DIAGNOSIS — I10 Essential (primary) hypertension: Secondary | ICD-10-CM

## 2020-10-07 LAB — CBC WITH DIFF, BLOOD
Basophils %: 0 %
Basophils Absolute: 0 10*3/uL (ref 0.0–0.2)
Eosinophils %: 0.9 %
Eosinophils Absolute: 0.1 10*3/uL (ref 0.0–0.5)
Hematocrit: 41.2 % (ref 39.5–50.0)
Hgb: 13.7 G/DL (ref 13.5–16.9)
Lymphocytes %.: 25.5 %
Lymphocytes Absolute: 1.8 10*3/uL (ref 0.9–3.3)
MCH: 31.7 PG (ref 27.0–33.5)
MCHC: 33.2 G/DL (ref 32.0–35.5)
MCV: 95.4 FL (ref 81.5–97.0)
MPV: 7.7 FL (ref 7.2–11.7)
Monocytes %: 7.6 %
Monocytes Absolute: 0.5 10*3/uL (ref 0.0–0.8)
PLT Count: 238 10*3/uL (ref 150–400)
RBC: 4.32 10*6/uL — ABNORMAL LOW (ref 4.38–5.62)
RDW-CV: 20.2 % — ABNORMAL HIGH (ref 11.6–14.4)
Seg Neutro % (M): 66 %
Seg Neutro Abs (M): 4.6 10*3/uL (ref 2.0–8.1)
White Bld Cell Count: 6.9 10*3/uL (ref 4.0–10.5)

## 2020-10-07 LAB — COMPREHENSIVE METABOLIC PANEL, BLOOD
ALT: 42 U/L (ref 7–52)
AST: 48 U/L — ABNORMAL HIGH (ref 13–39)
Albumin: 4.3 G/DL (ref 4.2–5.5)
Alk Phos: 99 U/L (ref 34–104)
BUN: 14 mg/dL (ref 7–25)
Bilirubin, Total: 0.5 mg/dL (ref 0.0–1.4)
CO2: 26 mmol/L (ref 21–31)
Calcium: 9.5 mg/dL (ref 8.6–10.3)
Chloride: 100 mmol/L (ref 98–107)
Creat: 0.9 mg/dL (ref 0.7–1.3)
Electrolyte Balance: 11 mmol/L (ref 2–12)
Glucose: 111 mg/dL (ref 70–115)
Potassium: 4.2 mmol/L (ref 3.5–5.1)
Protein, Total: 7.5 G/DL (ref 6.0–8.3)
Sodium: 137 mmol/L (ref 136–145)
eGFR - high estimate: >60 (ref >59)
eGFR - low estimate: >60 (ref >59)

## 2020-10-07 LAB — COVID-19, FLU A/B PANEL (POC)
COVID-19 Result: NOT DETECTED
Influenza A, PCR: NOT DETECTED
Influenza B, PCR: NOT DETECTED
Respiratory Virus Comment: NOT DETECTED

## 2020-10-07 LAB — ECG 12-LEAD
P AXIS: 105 Deg
PR INTERVAL: 156 ms
QRS INTERVAL/DURATION: 105 ms
QT: 351 ms
QTc (Bazett): 397 ms
R AXIS: 19 Deg
R-R INTERVAL AVERAGE: 673 ms
T AXIS: 50 Deg
VENTRICULAR RATE: 89 {beats}/min

## 2020-10-07 LAB — PHOSPHORUS, BLOOD: Phosphorus: 3.9 MG/DL (ref 2.5–5.0)

## 2020-10-07 LAB — GLUCOSE, POINT OF CARE: Glucose, Point of Care: 118 MG/DL (ref 70–125)

## 2020-10-07 LAB — TROPONIN I, HIGH SENSITIVITY
Troponin I, High Sensitivity: 4 ng/L (ref 0–20)
Troponin I, High Sensitivity: 4 ng/L (ref 0–20)

## 2020-10-07 LAB — MAGNESIUM, BLOOD: Magnesium: 2 mg/dL (ref 1.9–2.7)

## 2020-10-07 MED ORDER — PHENOBARBITAL SODIUM 130 MG/ML IJ SOLN
260.0000 mg | Freq: Once | INTRAMUSCULAR | Status: AC
Start: 2020-10-07 — End: 2020-10-07
  Administered 2020-10-07 (×2): 260 mg via INTRAVENOUS
  Filled 2020-10-07: qty 2

## 2020-10-07 MED ORDER — THIAMINE HCL 100 MG OR TABS (CUSTOM)
100.0000 mg | ORAL_TABLET | Freq: Every day | ORAL | Status: DC
Start: 2020-10-07 — End: 2020-10-07

## 2020-10-07 MED ORDER — LACTATED RINGERS IV BOLUS (~~LOC~~)
1000.0000 mL | Freq: Once | INTRAVENOUS | Status: AC
Start: 2020-10-07 — End: 2020-10-07
  Administered 2020-10-07: 1000 mL via INTRAVENOUS

## 2020-10-07 MED ORDER — PHENOBARBITAL SODIUM 130 MG/ML IJ SOLN
130.0000 mg | Freq: Once | INTRAMUSCULAR | Status: AC
Start: 2020-10-07 — End: 2020-10-07
  Administered 2020-10-07 (×2): 130 mg via INTRAVENOUS
  Filled 2020-10-07: qty 1

## 2020-10-07 MED ORDER — TAB-A-VITE/BETA CAROTENE PO TABS
1.0000 | ORAL_TABLET | Freq: Every day | ORAL | Status: DC
Start: 2020-10-07 — End: 2020-10-07

## 2020-10-07 MED ORDER — FOLIC ACID 1 MG OR TABS
1.0000 mg | ORAL_TABLET | Freq: Every day | ORAL | Status: DC
Start: 2020-10-07 — End: 2020-10-07

## 2020-10-07 NOTE — Discharge Instructions (Signed)
Diagnosis/Evaluation:    Thank you for allowing [place] to care for you today at [facility]. Today, you were seen for chest pain and alcohol withdrawal. Based on your workup here in the emergency department, you were deemed stable for discharge.       Follow Up:  Please follow up with your regular doctor to ensure there are no other medical issues after your emergency department visit today.       Medications:  Continue your home medications as previously prescribed.    If you were prescribed any narcotic or sedative medications here in the ER, please do not drive after taking these medications and do not mix them with alcohol.     Please return to the emergency department if you experience ANY of the following:  Please return to the ER if you develop shortness of breath, fevers, worsening of your symptoms, or have any other concerns.      If you have any outstanding lab or imaging studies at the time of your discharge, the phone number to contact us here in the emergency department is [phone].

## 2020-10-07 NOTE — ED Notes (Signed)
Radiology at bedside for chest x ray

## 2020-10-07 NOTE — ED Notes (Signed)
Bed: 02  Expected date: 10/07/20  Expected time: 2:14 AM  Means of arrival:   Comments:  acls

## 2020-10-07 NOTE — Interdisciplinary (Signed)
Social Work Assessment, Adult        Patient Name:  Corey [name]   MRN: [id number]   Date of Birth: Jul 03, 1970    Age: 50 year old   Date of Admission:  10/07/2020         Service Date: Oct 07, 2020     Assessment  Assessment Type: Initial;Verbal     Referral Information  Referral Type: Discharge Planning;Substance Abuse;Homeless   Social Assessment  Where was the patient admitted from? *: Other (Comment) ([facility] withdrawal management)  Mode of Arrival: Other (Comment) (BIB [facility] staff)  Prior to Level of Function *: Ambulatory/Independent with ADL's  Assistive Device *: Not applicable  Primary Caretaker(s) *: Self  Primary Contact Name, Number and Relationship *: Pt denied  Permission to Contact *: Not Applicable     Social Determinates of Health  Living Arrangements *: Homeless  Post Acute Services Referred To: Other (Comment) (SUD tx/withdrawal)  Post Acute Services Name, Number, Contact Details: [facility], [address], [place], [place] [address] (tel: [phone]); Withdrawal management  Post Acute Resources Provided: Patient Declined  A List of Production designer, theatre/television/film Provided: Patient Film/video editor Provided: No - Event organiser Referral: Not Applicable  Primary Care Provider Contacted (Behavioral Health Only): None Known, Referral Provided  Meal Provided Prior to Discharge: Yes  Discharge Medications: None Prescribed  Patient Reported Discharge Destination: Other (Comment) ([facility] withdrawal management)  Discharge Destination Details: [facility], [address], [place], [place] [address] (tel: [phone])  Homeless Discharge Documentation Complete: CM/SW Completed  Available Assistance/Support System *: Family member(s)  Type of Residence *: Homeless  Has discharge transport been arranged?: Yes  Transportation Arrangement Details *: Ride with 24hr voucher  Transportation Company/Phone Number * : RN aware and  pt  Transportation * : Voucher  Patient/Family/Other Engaged in Discharge Planning *: Yes  Name, Relationship and Phone Number of Person Engaged in the Discharge Plan: Pt  Family/Caregiver's Assessed for *: Not Applicable  Respite Care *: Not Applicable  Patient/Family/Other Are In Agreement With Discharge Plan *: To be determined    Income Information  Income Source: Environmental consultant Resources: Medi-Cal    Mental Health Assessment  Past Mental Health Issues: Pt denied SI/HI.  Mental Status: No issues  Behavioral Assessment: No issues  Physical Assessment: No issues  Mental Status - Orientation: A&O    Substance Abuse History (CAGE-AID)  Substance Abuse History: Pt reports ETOH use and denies illicit drug use. Pt did not want to expand on ETOH use.    Referral To  Substance Abuse Referral: Your next level of care provider will manage your addictions treatment     Plans/Interventions/Discharge  Plan/Interventions: Explore needs and options for aftercare, provide referrals;Other (comment) (Confirm with [facility] for pt to return to facility)  Anticipated Discharge Destination: Other (Comment) ([facility] Withdrawal Management)  Barriers to Discharge *: None    Alcohol Use  Q1: How often do you have a drink containing alcohol?: Patient refused  AUDIT-C Total Score: 0     Education/Resources  Intervention/Education provided: No  Resources provided for substance abuse counseling: No  Audit Screening Initiated/Completed with in 24 hrs of Admission: Patient Refused     Substance Use (DAST)  How many times in the past year have you used a recreational drug or used a prescription medication for non-medical reasons?: None    Clinical Summary  CSW responded to [employer] SW consult re: crisis intervention and homeless d/c checklist. Per chart review, pt is a 50 year old male who presents with chest pain and alcohol withdrawal from [facility].    CSW presented to bedside, introduced self and role within the medical center, and engaged  pt in discussion. Pt presents as calm, cooperative, and with fair eye contact. CSW assessed living arrangements. Pt confirmed facesheet information, stating that address on FS is wives address but denied providing EC info. When CSW inquired on resources, pt states that he does not need resources as [facility] are "taking care of it". Pt reports ETOH use and denies illicit drug use. Pt denies SI/HI, and amenable to d/c back to [facility]. CSW inquired on d/c plan to which pt states that he will return to [facility] upon medical clearance. Pt amenable to this writer assisting in transpo back.    4656 - CSW then placed T/C to [facility], [address], [place], [place] [address] (tel: [phone]); s/w [place] who confirms that pt may return once transportation is scheduled.    CSW provided Ride w/ 24 hr voucher in pt's physical chart. RN Trinna Post aware that pt needs to be taken to [facility] lobby for ride to be scheduled. No further needs were identified at this time. CSW will remain available for arising psychosocial needs.    Geralyn Corwin, MSW  Clinical Social Worker II  Office: [phone]  Cell: [phone]

## 2020-10-07 NOTE — ED Provider Notes (Signed)
CHIEF COMPLAINT  Chest Pain - Adult (Chest pain non provoked, non radiating woke pt up 15 min pta. Pt noted to be detoxing from alcohol. 324 mg aspirin and nitro x 3 in the field by medics. )            HISTORY OF PRESENT ILLNESS:   Corey [name] is a 50 year old male who presents with chest pain and alcohol withdrawal.    Patient reports he was recently seen in the ED yesterday and went to [facility]. Last drink two nights ago. +hx of seizures due to alcohol withdrawal in the past, last was 3 months ago.     Patient also states he woke up tonight with chest pain 15 min PTA of medics. Substernal, nonradiating, pressure, sharp. +shortness of breath. Denies fevers, coughing, N/V, back pain, LE swelling, hx of blood clots. Denies previous episodes. +fam hx of MI at 51 yo.     BIB EMS, 50 yo M, detoxing from alcohol for 24 hours, cc CP 15 min ago, woke up from sleep, sharp substernal, non-radiating, 8/10, gave ASA and nitro x3, pain from 8 to 5/10. [place] [address], HR 105 sinus tach, RR 18, GCS 16. PMHx HTN, no meds, NKDA.    Location: substernal  Radiation: none  Quality: sharp, presure   Severity: moderate  Duration: <1 hour  Timing: acute    REVIEW OF SYSTEMS:  Constitutional: no fever  Head: no headache  CV: yes chest pain  Resp: yes shortness of breath  GI: no vomiting    All other systems reviewed and negative except as noted above        PAST MEDICAL HISTORY:  Past Medical History:   Diagnosis Date   . Alcohol abuse    . Hypertension       Patient Active Problem List    Diagnosis Date Noted   . Alcohol intoxication (CMS-HCC) 10/06/2020       SURGICAL HISTORY:  Elbow surgery    ALLERGIES:  No Known Allergies      CURRENT MEDICATIONS:   Please see nursing notes    FAMILY HISTORY:  Reviewed and considered non-contributory   +famil hx of MI     SOCIAL HISTORY:  Tobacco: no  Alcohol: yes, last use 24 hours ago  Drug use: no    VITAL SIGNS:  First Vitals [10/07/20 0224]   Temperature Heart Rate Respirations  Blood pressure (BP) SpO2   98.2 F (36.8 C) 94 18 (!) 176/91 99 %       PHYSICAL EXAM:  General: Awake, Alert, appears to be in no apparent distress   Head: Normocephalic, atraumatic   Eyes: No scleral icterus, no conjunctival injection   ENT: Normal appearing ears externally, normal appearing nose externally   Neck: Supple, no tracheal deviation   Respiratory: Normal effort, no audible stridor, CTAB  Cardiovascular: Normal rate, regular rhythm, warm and well perfused        Abdomen: Soft and nontender  Skin: No jaundice, No rash   Extremities: No edema  Neuro: Face symmetric, normal speech    MEDICAL DECISION MAKING:  Corey [name] is a 50 year old male who presents with chest pain, alcohol withdrawal. Differential diagnosis includes alcohol withdrawal given history, less likely ACS. Considering musculoskeletal strain vs sprain, costochondritis, anxiety, GERD. No fevers or cough concerning for pneumonia vs COVID vs URI. No evidence of volume overload on exam concerning for congestive heart failure. Patient is low risk on  Wells score, doubt pulmonary embolism. Will obtain labs, EKG, CXR, and reassess. Will provide IVF, phenobarbital, and reassess.      ED COURSE:  Workup Summary       Value Comment By Time     ECG 12 Lead No STEMI Bethena Midget, DO 05/28 0233      JS to JB: 89M here yesterday for ETOH withdrawal, back again for chest pain, pending workup (trop x2). Abigail Miyamoto, MD 05/28 0304     WBC: 6.9 (Reviewed) Daivd Council D, DO 05/28 0446     Hgb: 13.7 (Reviewed) Daivd Council D, DO 05/28 0446     Plt Count: 238 (Reviewed) Daivd Council D, DO 05/28 0446     Sodium: 137 (Reviewed) Daivd Council D, DO 05/28 0446     Potassium: 4.2 (Reviewed) Daivd Council D, DO 05/28 0446     BUN: 14 (Reviewed) Daivd Council D, DO 05/28 0446     Creatinine: 0.9 (Reviewed) Daivd Council D, DO 05/28 0446     ALT (SGPT): 42 (Reviewed) Daivd Council D, DO 05/28 0446      AST (SGOT): 48 (Reviewed)  Daivd Council D, DO 05/28 0446     Phosphorous: 3.9 (Reviewed) Bethena Midget, DO 05/28 0446     Magnesium: 2.0 (Reviewed) Bethena Midget, DO 05/28 2409     COVID-19 Result: NOT DETECTED (Reviewed) Daivd Council D, DO 05/28 0446     Troponin I, High Sensitivity: 4 (Reviewed) Bethena Midget, DO 05/28 0446     X-Ray Chest Single View The cardiomediastinal silhouette is within normal limits.There is no focal consolidation, pleural effusion or pneumothorax.The bones are unremarkable. Bethena Midget, DO 05/28 [phone]      On reassessment, patient is improved. Stating he is still having symptoms of alcohol withdrawal. Less tremulous on exam. Will order addl 130mg  phenobarb prior to dc , DO 05/28 6/28      Patient is improved upon reassessment and is stable for discharge at this time. Vital signs are stable. Labs and imaging were reviewed and are unremarkable. Patient is amenable to discharge. Patient was educated on results and all concerns were addressed. Good care instructions and return precautions were given. Patient understands and agrees with plan to follow up with primary care provider. [id number], DO 05/28 6/28          DIAGNOSIS:    ICD-10-CM ICD-9-CM   1. Chest pain, unspecified type  R07.9 786.50   2. Alcohol withdrawal syndrome without complication (CMS-HCC)  F10.230 291.81        [id number], DO  Resident  10/07/20 0559    Attending Attestation: I was present with the resident during the history and exam.  I discussed the case with the resident and agree with the findings and plan as documented by the resident.  My additions or revisions are included in the record.     10/09/20, MD  Emergency Medicine Attending        Smart, Donovan Kail, MD  10/08/20 1440

## 2020-10-07 NOTE — ED Notes (Addendum)
Pt verbalized understanding of AVS with questions answered by RN at bedside. Pt encouraged to follow up with PCP or return to ED if condition worsens. Pt A/O x 4, VSS, able to ambulate with steady gait off the unit, in possession of all belongings. Pt understood plan to pick up ride voucher provided by social worker at [facility]. Ride will take him to [facility] for further treatment.

## 2020-10-08 LAB — ECG 12-LEAD
P AXIS: 99 Deg
PR INTERVAL: 172 ms
QRS INTERVAL/DURATION: 99 ms
QT: 358 ms
QTc (Bazett): 398 ms
R AXIS: -16 Deg
R-R INTERVAL AVERAGE: 718 ms
T AXIS: 64 Deg
VENTRICULAR RATE: 83 {beats}/min

## 2020-10-08 NOTE — ED EKG Interpretation (Signed)
ED EKG Interpretation    EKG: Normal Sinus Rhythm with Normal Axis and no ischemic changes.

## 2020-10-17 ENCOUNTER — Emergency Department
Admission: EM | Admit: 2020-10-17 | Discharge: 2020-10-18 | Disposition: A | Discharge status: Home / Self Care | Payer: No Typology Code available for payment source | Attending: Emergency Medicine | Admitting: Emergency Medicine

## 2020-10-17 DIAGNOSIS — F10230 Alcohol dependence with withdrawal, uncomplicated: Secondary | ICD-10-CM

## 2020-10-17 DIAGNOSIS — F10129 Alcohol abuse with intoxication, unspecified: Secondary | ICD-10-CM | POA: Insufficient documentation

## 2020-10-17 DIAGNOSIS — F419 Anxiety disorder, unspecified: Secondary | ICD-10-CM

## 2020-10-17 DIAGNOSIS — Z9151 Personal history of suicidal behavior: Secondary | ICD-10-CM | POA: Insufficient documentation

## 2020-10-17 DIAGNOSIS — F10920 Alcohol use, unspecified with intoxication, uncomplicated: Secondary | ICD-10-CM

## 2020-10-17 DIAGNOSIS — F32A Depression, unspecified: Secondary | ICD-10-CM | POA: Insufficient documentation

## 2020-10-17 DIAGNOSIS — Z5900 Homelessness unspecified: Secondary | ICD-10-CM | POA: Insufficient documentation

## 2020-10-17 DIAGNOSIS — I10 Essential (primary) hypertension: Secondary | ICD-10-CM

## 2020-10-17 DIAGNOSIS — F489 Nonpsychotic mental disorder, unspecified: Secondary | ICD-10-CM

## 2020-10-17 DIAGNOSIS — F1020 Alcohol dependence, uncomplicated: Secondary | ICD-10-CM

## 2020-10-17 DIAGNOSIS — R45851 Suicidal ideations: Secondary | ICD-10-CM

## 2020-10-17 DIAGNOSIS — Z20822 Contact with and (suspected) exposure to covid-19: Secondary | ICD-10-CM | POA: Insufficient documentation

## 2020-10-17 LAB — URINALYSIS
Bilirubin, UA: NEGATIVE
Glucose, UA: NEGATIVE MG/DL
Hemoglobin, UA: NEGATIVE
Ketones, UA: NEGATIVE MG/DL
Leukocyte Esterase, UA: NEGATIVE
Nitrite, UA: NEGATIVE
Protein, UA: NEGATIVE MG/DL
Specific Grav, UA: 1.004 (ref 1.003–1.030)
Urobilinogen, UA: <2 MG/DL (ref <2.0)
WBC, UA: <1 #/HPF (ref 0–5)
pH, UA: 6 (ref 5.0–8.0)

## 2020-10-17 LAB — DRUG SCREEN RAPID PANEL 12 NO CONFIRMATION, URINE
Barbiturates: NOT DETECTED
Fentanyl,  U Scrn: NOT DETECTED
Opiates: NOT DETECTED
Oxycodone Lvl, UR: NOT DETECTED
PCP: NOT DETECTED
THC: NOT DETECTED

## 2020-10-17 LAB — COVID-19, FLU A/B PANEL (POC)
COVID-19 Result: NOT DETECTED
Influenza A, PCR: NOT DETECTED
Influenza B, PCR: NOT DETECTED
Respiratory Virus Comment: NOT DETECTED

## 2020-10-17 LAB — DRUG SCREEN RAPID PANEL 10 NO CONFIRMATION, URINE
Amphetamines, Urine: NOT DETECTED
Benzodiazepines: NOT DETECTED
Cocaine: NOT DETECTED
MDMA: NOT DETECTED
Methadone: NOT DETECTED
Propoxyphene: NOT DETECTED

## 2020-10-17 MED ORDER — LACTATED RINGERS IV BOLUS (~~LOC~~)
1000.0000 mL | Freq: Once | INTRAVENOUS | Status: AC
Start: 2020-10-17 — End: 2020-10-17
  Administered 2020-10-17 (×2): 1000 mL via INTRAVENOUS
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
## 2020-10-17 NOTE — ED Notes (Signed)
Report called to [facility] in [place]
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
## 2020-10-18 MED ORDER — CHLORDIAZEPOXIDE HCL 25 MG OR CAPS
100.0000 mg | ORAL_CAPSULE | ORAL | Status: DC | PRN
Start: 2020-10-18 — End: 2020-10-18

## 2020-10-18 MED ORDER — LOSARTAN POTASSIUM 50 MG OR TABS
50.0000 mg | ORAL_TABLET | Freq: Once | ORAL | Status: AC
Start: 2020-10-18 — End: 2020-10-18
  Administered 2020-10-18: 50 mg via ORAL
  Filled 2020-10-18: qty 1

## 2020-10-18 MED ORDER — LOSARTAN POTASSIUM 25 MG OR TABS
25.0000 mg | ORAL_TABLET | Freq: Every day | ORAL | 0 refills | Status: AC
Start: 2020-10-18 — End: 2020-11-01

## 2020-10-18 MED ORDER — THIAMINE HCL 100 MG OR TABS (CUSTOM)
100.0000 mg | ORAL_TABLET | Freq: Every day | ORAL | Status: DC
Start: 2020-10-18 — End: 2020-10-18
  Administered 2020-10-18 (×2): 100 mg via ORAL
  Filled 2020-10-18: qty 1

## 2020-10-18 MED ORDER — CHLORDIAZEPOXIDE HCL 25 MG OR CAPS
50.0000 mg | ORAL_CAPSULE | ORAL | Status: DC | PRN
Start: 2020-10-18 — End: 2020-10-18
  Administered 2020-10-18 (×2): 50 mg via ORAL
  Filled 2020-10-18: qty 2

## 2020-10-18 MED ORDER — FOLIC ACID 1 MG OR TABS
1.0000 mg | ORAL_TABLET | Freq: Every day | ORAL | Status: DC
Start: 2020-10-18 — End: 2020-10-18
  Administered 2020-10-18: 1 mg via ORAL
  Filled 2020-10-18: qty 1

## 2020-10-18 MED ORDER — LOSARTAN POTASSIUM 25 MG OR TABS
25.0000 mg | ORAL_TABLET | Freq: Every day | ORAL | 0 refills | Status: DC
Start: 2020-10-18 — End: 2020-10-18

## 2020-10-18 MED ORDER — TAB-A-VITE/BETA CAROTENE PO TABS
1.0000 | ORAL_TABLET | Freq: Every day | ORAL | Status: DC
Start: 2020-10-18 — End: 2020-10-18
  Administered 2020-10-18 (×2): 1 via ORAL
  Filled 2020-10-18: qty 1

## 2020-10-18 NOTE — ED Notes (Signed)
Assumed care, primary RN on break. Pt asleep in bed. Breathing is even and unlabored, no signs of distress noted. Remains on continuous video monitoring and Q15 minute safety checks.

## 2020-10-18 NOTE — ED Notes (Signed)
Belongings and valuables given back to pt.

## 2020-10-18 NOTE — Discharge Instructions (Addendum)
Thank you for allowing [place] to care for you today at [facility]. Today, you were seen for suicidal ideation, alcoholism, and hypertension. Based on your workup here in the emergency department, you were deemed stable for discharge.      Please follow up with your regular doctor to ensure proper management of any chronic medical conditions and/or to be referred to any specialists you may require.      Please return to the ER if you develop worsening of your symptoms, or have any other concerns.       If you have any outstanding lab or imaging studies at the time of your discharge, the phone number to contact us here in the emergency department is [phone].

## 2020-10-18 NOTE — ED Notes (Signed)
Dr. roh at bedside.

## 2020-10-18 NOTE — ED Notes (Addendum)
Pr AO x 4, no signs of acute distress.  Pt reporting feeling like " having withdrawals ".  Pt claims history of withdrawals with seizure episodes.  Pt claims anxiety with minimal hand tremors with mild headache.   Denies  nausea or hallucinations.   MD made aware CIWA protocol ordered.   Per ED charge nurse pt may stay in [facility] with CIWA assessment as ordered.

## 2020-10-18 NOTE — Progress Notes (Signed)
Department of Psychiatry and Human Behavior  Consult and Liaison Discharge Note    Current Hospital Stay:   0 days - Admitted on: 10/17/2020  LPS/Legal Status:   Legal Status/Legal Hold   Procedures   . Place Patient on Level II        Subjective  Patient reports he is not suicidal, saying "I just said that so I could stay here, I know how to manipulate the system. I'm going to get on the phone and talk to [facility]."     The patient stated they he was kicked out of his boarding care called [facility].  This was due to him continuing to drink alcohol once again.  He states that he is not suicidal and he said he wanted to hurt himself last night in order to have a place to stay.  He said that he is "use to manipulating the system" and that he is able to follow-up to live at home with his wife, however, he would like to stop drinking alcohol and follow-up with the detox facility in order to stop drinking.  Patient understand that he has an alcohol use disorder, but adamantly denies fleeting thoughts of self-harm or suicidal ideation.  He states that he wants to live and would like assistance to follow up with [facility] to speak with his case manager Reme in which she has a therapeutic relationship with.    ROS: ROS: A comprehensive review of systems was negative.    [place] Suicide Risk Severity Rate Scale (C-SSRS):     1. While you were in the hospital, have you wished you were dead or wished you could go to sleep and not wake up?:  No  2. While you were in the hospital, have you actually had any thoughts of killing yourself?:  No    CSSRS Risk Level:  Minimal Risk    Objective  Vital Signs:  Temperature:  [98 F (36.7 C)-98.1 F (36.7 C)] 98 F (36.7 C) (06/07 2347)  Blood pressure (BP): (129-152)/(87-93) 151/89 (06/08 0534)  Heart Rate:  [80-90] 90 (06/08 0534)  Respirations:  [18-20] 20 (06/08 0534)  Pain Score: 0 (06/07 2217)  O2 Device: None (Room air) (06/08 0534)  SpO2:  [96 %-100 %] 98 % (06/08 0534)                         Mental Status Exam:   Physical appearance: Disheveled with poor grooming.  Oriented to Person: Yes    Oriented to Place: Yes    Oriented to Time: Yes    Relatedness:  Engaged  Eye contact:  Good  Attitude:  Cooperative  Affect (clinician's impression):  Constricted  Speech quality:  Rapid  Thought Process:  Logical and tangential  Thought content:  Delusions  Perception:  Unremarkable  Judgement:  Mildly impaired  Impulse control:  Significant impairment  Insight:  Poor  Suicide:  None  Homicide:  None    Current Medications:  . folic acid  1 mg Daily   . multivitamin adult  1 tablet Daily   . thiamine  100 mg Daily     . chlordiazePOXIDE  50 mg Q1H PRN 50 mg at 10/18/20 0538    Or   . chlordiazePOXIDE  100 mg Q1H PRN         Laboratory data:   Recent Results (from the past 24 hour(s))   Urinalysis    Collection Time: 10/17/20 11:14 PM  Result Value Ref Range    UR Sample Site, UA URINE,TYPE NOT SPECIFIED     Color, UA YELLOW     Clarity CLEAR     Specific Grav, UA 1.004 1.003 - 1.030    pH, UA 6 5.0 - 8.0    Protein, UA NEGATIVE NEGATIVE MG/DL    Glucose, UA NEGATIVE NEGATIVE MG/DL    Ketones, UA NEGATIVE NEGATIVE MG/DL    Bilirubin, UA NEGATIVE NEGATIVE    Hemoglobin, UA NEGATIVE NEGATIVE    Leukocyte Esterase, UA NEGATIVE NEGATIVE    Nitrite, UA NEGATIVE NEGATIVE    Urobilinogen, UA <2 <2.0 MG/DL    RBC, UA NONE 0 - 3 #/HPF    WBC, UA <1 0 - 5 #/HPF    WBC Clumps, UA NONE NONE #/HPF    Bacteria, UA NONE NONE    Squamous Epithelial, UA NONE 0 - 5 /HPF    Mucous, UA NONE NONE /LPF   Drug Screen Rapid Panel 10 No Confirmation, Urine    Collection Time: 10/17/20 11:14 PM   Result Value Ref Range    Drug Screen Type       Screening results are presumptive and should be interpreted with caution in conjunction with other clinical information.    Opiates UNDETECTED UNDETECTED    Cocaine UNDETECTED UNDETECTED    PCP UNDETECTED UNDETECTED    Amphetamines, Urine UNDETECTED UNDETECTED    THC UNDETECTED  UNDETECTED    Methadone UNDETECTED UNDETECTED    Propoxyphene UNDETECTED UNDETECTED    Benzodiazepines UNDETECTED UNDETECTED    Barbiturates UNDETECTED UNDETECTED    MDMA UNDETECTED UNDETECTED    Oxycodone Lvl, UR UNDETECTED UNDETECTED    Fentanyl,  U Scrn UNDETECTED UNDETECTED    Drug Screen Cutoff Concentration Drugs Covered and Cutoff Concentration:    COVID-19, Flu A/B Panel (POC)    Collection Time: 10/17/20 11:14 PM    Specimen: Nasal-Pharyngeal; Swab   Result Value Ref Range    Respiratory Virus Source SWAB     COVID-19 Result NOT DETECTED     Influenza A, PCR NOT DETECTED     Influenza B, PCR NOT DETECTED     Respiratory Virus Comment Reference Range: Not Detected        ASSESSMENT AND PLAN:  Corey [name] is a 50 year old male with previous diagnosis of "manic depressive or schizophrenia" per selfwho presents to ED brought in by self for alcohol intoxication and SI. UDS neg.    Initial presentation was representative of suicidal ideation in the context of alcohol use.  On repeat evaluation this morning, the patient was logical, linear, and was able to make a reasonable discharge plan stating that he would follow up with his [facility], and case manager named Reme.  Patient was adamant and not having suicidal ideation, and stated concerning thoughts of suicidal ideation on admission in order to have a reasonable place to sleep because he was kicked out of his boarding care for alcohol use.  The patient did exhibit grandiosity, and entitlement during the interview.  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  Since the patient is able to make a reasonable discharge plan, expresses reasons to live, and is  able to link the medical team with this [facility], the patient is no longer DTS or GD and does not need acute inpatient psychiatric stabilization.      Clinical Global Impression - Severity: 3= Mildly ill  :Suggested Guidelines=  Clearly established symptoms with minimal, if any, difficulty in social and occupational function. .      There are no active hospital problems to display for this patient.      Recommendations:  1. Patient does not meet criteria for inpatient psychiatric hospitalization on the basis of DTS, DTO or GD.   Marland Kitchen  LPS status: Voluntary.    2. The following discharge plan was discussed with the patient:    . Housing: Financial trader)    . Will provide prescription(s) the following medications:   o No psychiatric medications     . Transportation: Public bus. Patient can identify which bus route to take.    . Obtained collateral from: None.    . Follow-Up care: [facility]    . Safety Precautions: Recommended abstaining from alcohol and recreational drugs.     . See MHA/SW notes for additional resources provided to patient as indicated       3. Emergency department return precautions were discussed with Corey [name] who expressed understanding that he should return to the nearest emergency department if he develops or experiences worsening thoughts of self-harm, suicidal or homicidal ideation, or psychosis, or if feeling unable to keep themselves safe.      Mr. Steidle was seen and evaluated by attending psychiatrist, Dr. Lavenia Atlas, who agrees with the above assessment and recommendations. Thank you for this interesting consultation and for including [place] in the multidisciplinary care of Mr. Speegle. Please page [place] at [phone] for any further or new questions.    Himakar Nagam MS4    Arbutus Ped, MD   Resident Physician, PGY-1

## 2020-10-18 NOTE — ED Notes (Signed)
Escorted to [employer] for taxi per Artist.

## 2020-10-18 NOTE — ED Notes (Signed)
Pt woke up and came out of his room asking for librium claiming " I'm having withdrawals " .  CIWA score of 12, Librium dose given as ordered ( see MAR ) .
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
## 2020-10-18 NOTE — ED Notes (Signed)
Bed: 25  Expected date: 10/18/20  Expected time: 7:55 AM  Means of arrival:   Comments:  [facility]

## 2020-10-18 NOTE — ED Notes (Signed)
Bed: BL3-06  Expected date: 10/17/20  Expected time:   Means of arrival:   Comments:  23

## 2020-10-18 NOTE — Interdisciplinary (Signed)
Social Work Discharge Note     Discharge Plan  Living Arrangements *: (P) Homeless  Post Acute Services Referred To: (P) Other (Comment)  Post Acute Services Name, Number, Contact Details: (P) [facility] [phone]; CM Ream  Post Acute Resources Provided: (P) Patient Declined  A List of Tax adviser Provided: (P) Patient Declined  Weather Appropriate Clothing Provided: (P) No - Nurse, children's Referral: (P) Not Applicable  Primary Care Provider Contacted (Behavioral Health Only): (P) None Known, Referral Provided  Meal Provided Prior to Discharge: (P) Yes  Discharge Medications: (P) Provided by Outpatient Pharmacy and Handed to the Patient  Patient Reported Discharge Destination: (P) Other (Comment)  Discharge Destination Details: (P) Pt returning to prior living arrangement  Homeless Discharge Documentation Complete: (P) CM/SW Completed  Patient/Family/Other Engaged in Discharge Planning *: (P) Not Applicable  Family/Caregiver's Assessed for *: (P) Not Applicable  Respite Care *: (P) Not Applicable  Patient/Family/Other Are In Agreement With Discharge Plan *: (P) Yes  Patient Has Decision Making Capacity *: (P) Yes  Verified phone number for DC location *: (P) Yes  Verified address for DC location *: (P) Yes  Public Health Clearance Needed *: (P) Not Applicable    Discharge Planning Needs  Actions Needed for Discharge: (P) Final DC Order - Post-acute care arranged  Advance Directive:: (P) No  Does this patient have CM discharge planning needs?: (P) No  Does this patient have SW discharge planning needs?: (P) Yes  SW Needs Met?: (P) Yes    Discharge Transportation  Transportation Company/Phone Number * : Mamie Nick) [email or web address]  Date: (P) 10/18/20  Transportation * : (P) Counsellor Details *: (P) [id number] to [facility] [address], [place], [place] [address]  Team notified of transport : (P) Yes    Final Discharge Destination/Services  Final  Discharge Destination/Services *: (P) Other (Comment) (Pt returning to prior living arrangement)    Referrals/Resources  Substance Abuse Referral: (P) Your next level of care provider will manage your addictions treatment        CSW participated in AM Psych rounds. Per Psych recommendations, pt no longer requires inpatient psych hospitalization and is to be discharged from ED today. Per pt, he plans to follow-up with his case manager @ [facility] and requested assistance with transportation to go to [facility]. Pt consented to have CSW contact [facility] @ [phone] to confirm this d/c plan. CSW spoke with Corene Cornea @ [facility] who confirmed pt can walk-in to their clinic today for case management assistance and provided address: [address], [place], [place] [address]. CSW completed [id number] voucher and placed it in pt's chart. CSW notified RN of pt's d/c plan and recommended pt be escorted to [place] with voucher form for arrangement of transportation. Pt declined all community resources offered by CSW, stating he is familiar with resources and that he plans to follow-up with [facility] for further assistance. Pt to be discharged today. No further social work needs identified at this time.     Sinclair Grooms, LCSW  Clinical Social Worker III  Office: [phone]  Cell: [phone]  Pager: [phone]

## 2020-10-18 NOTE — ED Notes (Signed)
Psychiatry attending Dr. Rawles and psychiatry team are at bedside.

## 2023-04-01 ENCOUNTER — Other Ambulatory Visit: Payer: Self-pay

## 2023-04-02 LAB — DIFFERENTIAL - ~~LOC~~
Basophils %: 1.1 % (ref 0.0–2.0)
Basophils Absolute: 0.1 10*3/uL (ref 0.0–0.2)
Eosinophils %: 7.8 % — ABNORMAL HIGH (ref 0.0–7.0)
Eosinophils Absolute: 0.4 10*3/uL (ref 0.0–0.5)
Lymphocytes %: 40.5 % (ref 14.0–52.0)
Lymphocytes Absolute: 2.1 10*3/uL (ref 0.9–3.3)
Monocytes %: 11 % (ref 1.0–11.0)
Monocytes Absolute: 0.6 10*3/uL (ref 0.0–0.8)
Neutrophils %: 39.6 % (ref 39.0–88.0)
Neutrophils Absolute: 2.1 10*3/uL (ref 2.0–8.1)

## 2023-04-02 LAB — HEMOGRAM, BLOOD - ~~LOC~~
HCT: 40.6 % (ref 39.5–50.0)
HGB: 13.8 g/dL (ref 13.5–16.9)
MCH: 30 pg (ref 27.0–33.5)
MCHC: 34 g/dL (ref 32.0–35.5)
MCV: 88.2 fl (ref 81.5–97.0)
MPV: 9.7 fl (ref 7.2–11.7)
PLT Count: 222 10*3/uL (ref 150–400)
RBC: 4.6 10*6/uL (ref 4.38–5.62)
RDW-CV: 14.2 % (ref 11.6–14.4)
WBC: 5.2 10*3/uL (ref 4.0–10.5)

## 2023-04-02 LAB — COMPREHENSIVE METABOLIC PANEL, BLOOD - ~~LOC~~
ALT: 9 U/L (ref 7–52)
AST: 19 U/L (ref 13–39)
Albumin: 4.3 g/dL (ref 4.2–5.5)
Alkaline Phosphatase: 83 U/L (ref 34–104)
BUN: 22 mg/dL (ref 7–25)
Bilirubin, Total: 0.4 mg/dL (ref <=1.4)
Calcium: 9.5 mg/dL (ref 8.6–10.3)
Carbon Dioxide: 27 mmol/L (ref 21–31)
Chloride: 105 mmol/L (ref 98–107)
Creatinine: 1.1 mg/dL (ref 0.7–1.3)
Electrolyte Balance: 8 mmol/L (ref 2–12)
Glucose: 79 mg/dL — ABNORMAL LOW (ref 85–125)
Potassium: 4.7 mmol/L (ref 3.5–5.1)
Protein, Total: 7.1 g/dL (ref 6.0–8.3)
Sodium: 140 mmol/L (ref 136–145)
eGFR: 80 mL/min/{1.73_m2} (ref >=60)

## 2023-04-02 LAB — TSH, BLOOD - ~~LOC~~: TSH, Ultrasensitive: 2.847 u[IU]/mL (ref 0.450–4.120)

## 2023-04-03 LAB — HEMOGLOBIN A1C - ~~LOC~~: Hgb A1C: 5.4 % (ref 4.6–5.6)

## 2024-02-02 ENCOUNTER — Emergency Department
Admission: EM | Admit: 2024-02-02 | Discharge: 2024-02-03 | Disposition: A | Discharge status: Home Health | Payer: MEDICAID | Attending: Emergency Medicine | Admitting: Emergency Medicine

## 2024-02-02 DIAGNOSIS — F10129 Alcohol abuse with intoxication, unspecified: Secondary | ICD-10-CM

## 2024-02-02 DIAGNOSIS — I10 Essential (primary) hypertension: Secondary | ICD-10-CM | POA: Insufficient documentation

## 2024-02-02 DIAGNOSIS — Z79899 Other long term (current) drug therapy: Secondary | ICD-10-CM | POA: Insufficient documentation

## 2024-02-02 DIAGNOSIS — F1010 Alcohol abuse, uncomplicated: Secondary | ICD-10-CM

## 2024-02-02 DIAGNOSIS — Z23 Encounter for immunization: Secondary | ICD-10-CM | POA: Insufficient documentation

## 2024-02-02 DIAGNOSIS — R000 Tachycardia, unspecified: Secondary | ICD-10-CM | POA: Insufficient documentation

## 2024-02-02 DIAGNOSIS — R1084 Generalized abdominal pain: Secondary | ICD-10-CM | POA: Insufficient documentation

## 2024-02-02 DIAGNOSIS — F1024 Alcohol dependence with alcohol-induced mood disorder: Secondary | ICD-10-CM | POA: Diagnosis present

## 2024-02-02 DIAGNOSIS — F319 Bipolar disorder, unspecified: Secondary | ICD-10-CM | POA: Insufficient documentation

## 2024-02-02 DIAGNOSIS — F1590 Other stimulant use, unspecified, uncomplicated: Secondary | ICD-10-CM | POA: Insufficient documentation

## 2024-02-02 DIAGNOSIS — Z5900 Homelessness unspecified: Secondary | ICD-10-CM | POA: Insufficient documentation

## 2024-02-02 DIAGNOSIS — R45851 Suicidal ideations: Secondary | ICD-10-CM

## 2024-02-02 DIAGNOSIS — Y908 Blood alcohol level of 240 mg/100 ml or more: Secondary | ICD-10-CM | POA: Insufficient documentation

## 2024-02-02 MED ORDER — THIAMINE HCL 100 MG OR TABS (CUSTOM)
100.0000 mg | ORAL_TABLET | Freq: Every day | ORAL | Status: DC
Start: 2024-02-03 — End: 2024-02-03
  Administered 2024-02-03: 100 mg via ORAL
  Filled 2024-02-02: qty 1

## 2024-02-02 MED ORDER — THIAMINE HCL 100 MG/ML IJ SOLN
INTRAVENOUS | Status: DC
Start: 2024-02-03 — End: 2024-02-02

## 2024-02-02 MED ORDER — THIAMINE HCL 100 MG/ML IJ SOLN
100.0000 mg | Freq: Once | INTRAVENOUS | Status: DC
Start: 2024-02-02 — End: 2024-02-02

## 2024-02-02 MED ORDER — CHLORDIAZEPOXIDE HCL 25 MG OR CAPS
50.0000 mg | ORAL_CAPSULE | ORAL | Status: DC | PRN
Start: 2024-02-02 — End: 2024-02-03
  Administered 2024-02-03 (×3): 50 mg via ORAL
  Filled 2024-02-02 (×3): qty 2

## 2024-02-02 MED ORDER — HEPATITIS A VACCINE 1440 EL U/ML IM SUSP
1440.0000 [IU] | Freq: Once | INTRAMUSCULAR | Status: AC
Start: 2024-02-02 — End: 2024-02-03
  Administered 2024-02-03: 1440 [IU] via INTRAMUSCULAR
  Filled 2024-02-02: qty 1

## 2024-02-02 MED ORDER — THIAMINE HCL 100 MG/ML IJ SOLN
100.0000 mg | Freq: Once | INTRAMUSCULAR | Status: AC
Start: 2024-02-02 — End: 2024-02-03
  Administered 2024-02-03: 100 mg via INTRAVENOUS
  Filled 2024-02-02: qty 2

## 2024-02-02 MED ORDER — CHLORDIAZEPOXIDE HCL 25 MG OR CAPS
100.0000 mg | ORAL_CAPSULE | ORAL | Status: DC | PRN
Start: 2024-02-02 — End: 2024-02-03

## 2024-02-02 MED ORDER — FOLIC ACID 1 MG OR TABS
1.0000 mg | ORAL_TABLET | Freq: Every day | ORAL | Status: DC
Start: 2024-02-03 — End: 2024-02-03
  Administered 2024-02-03: 1 mg via ORAL
  Filled 2024-02-02: qty 1

## 2024-02-02 MED ORDER — LACTATED RINGERS IV BOLUS (~~LOC~~)
1000.0000 mL | Freq: Once | INTRAVENOUS | Status: AC
Start: 2024-02-02 — End: 2024-02-03
  Administered 2024-02-03: 1000 mL via INTRAVENOUS

## 2024-02-02 MED ORDER — ONDANSETRON HCL 4 MG/2ML IV SOLN
4.0000 mg | Freq: Once | INTRAMUSCULAR | Status: AC
Start: 2024-02-02 — End: 2024-02-03
  Administered 2024-02-03: 4 mg via INTRAVENOUS
  Filled 2024-02-02: qty 2

## 2024-02-02 MED ORDER — TAB-A-VITE/BETA CAROTENE PO TABS
1.0000 | ORAL_TABLET | Freq: Every day | ORAL | Status: DC
Start: 2024-02-03 — End: 2024-02-03
  Administered 2024-02-03: 1 via ORAL
  Filled 2024-02-02: qty 1

## 2024-02-02 NOTE — ED Provider Notes (Signed)
 CHIEF COMPLAINT:  Alcohol  Problem (Having difficulty stopping drinking. Last drink was 2 seconds ago no one wants to fucking help me so I'm not going to fucking stop pt is having repetitive statements and becoming more agitated. )     HISTORY OF PRESENT ILLNESS:  Interpreter used: No (English Preferred Language)    Corey [name] is a 53 year old male who presents with intoxication. Patient states that tonight he tried to drink myself to death. He recently Got out of prison and states that the resources have been unhelpful and that he has no coping skills. He fixes everything with alcohol . He states other possible medication is suboxone and seroquel, he remembers someone had told him he may be schizophrenic. No HI or hallucinations. No nausea/vomiting, chest pain, SOB, cough. He states that he has generalized abdominal pain that gets worse with drinking.               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    Alcohol  intoxication 10/06/2020     SURGICAL HISTORY:  Past Surgical History[2]     ALLERGIES:  Allergies[3]      FAMILY HISTORY:  Reviewed and considered non-contributory     SOCIAL HISTORY/DETERMINANTS OF HEALTH:  none     Socioeconomic History    Marital status: Married   Tobacco Use    Smoking status: Never    Smokeless tobacco: Never   Substance and Sexual Activity    Alcohol  use: Yes     Alcohol /week: 10.0 standard drinks of alcohol      Types: 10 Glasses of wine per week     Comment: 10 glasses a day    Drug use: Never      VITAL SIGNS:  First Vitals [02/02/24 2306]   Temperature Heart Rate Respirations Blood pressure (BP) SpO2   97.3 F (36.3 C) 108 20 (!) 137/100 98 %     PHYSICAL EXAM:  General: Awake, alert, in no apparent distress.  Head: Normocephalic, atraumatic.  Respiratory: Breathing comfortably and in no respiratory distress. No  audible wheezing or stridor.   Cardiovascular: Extremities are warm and well perfused. Tachycardic  Abdomen: soft, nontender  Neuro: Awake and alert. Moving extremities.  Psych: Answers questions appropriately.  Physical Exam  Constitutional:       Comments: Intoxicated, slurring speech, agitated, restless   Abdominal:      Palpations: Abdomen is soft.      Tenderness: There is no abdominal tenderness.   Psychiatric:         Attention and Perception: He does not perceive auditory or visual hallucinations.         Speech: Speech is not rapid and pressured.         Behavior: Behavior is agitated and hyperactive. Behavior is not aggressive.         Thought Content: Thought content is not paranoid. Thought content includes suicidal ideation. Thought content does not include homicidal ideation. Thought content includes suicidal plan.            MEDICAL DECISION MAKING:    Initial Impressions:  Corey [name] is a 53 year old male who presents with EtOH intoxication in attempt to harm himself.   Vital signs and physical exam were notable for: tachycardia, see above  Differential diagnosis includes: EtOH intoxication, illicit substances use, schizophrenia, bipolar, psychosis.  Will order labs, electrolyte/fluid repletion, urine studies. Will place on CIWA protocol and consult psych/social/case mgt.    Workup Review:  Pertinent Lab Results (interpreted independently by me): Patient without electrolyte or blood abnormalities, meth positive, etoh 300+ correlating with acute intoxication. No concerning metabolic derangements, EKG reassuring. Unlikely acute pancreatitis with only slightly elevated lipase. Patient medically cleared for psych placement.          Disposition Decision:    Psych Placement                                     Patient will benefit from inpatient hospitalization for their psychiatric needs. Medically cleared and pending placement.               The following work up was performed during the  encounter. Orders placed after a disposition has been selected will not be listed here. A complete account of orders should be referenced elsewhere:     ED Orders (From admission, onward)      Ordered     Status Ordering Provider    02/02/24 2325  thiamine  (VITAMIN B1) tablet 100 mg  DAILY         Acknowledged Gayna Braddy [facility]    02/02/24 2325  folic acid  (FOLVITE ) tablet 1 mg  DAILY         Acknowledged Desarie Feild [facility]    02/02/24 2325  multivitamin adult (Tab-A-Vite/Beta Carotene ) tablet 1 tablet  DAILY         Acknowledged Roston Grunewald [facility]    02/03/24 0049  gabapentin  (NEURONTIN ) capsule 300 mg  3 TIMES DAILY         Last MAR action: Hold - by REYES, ZED on 02/03/24 at 0105 Lemoyne, DAVID    02/03/24 0049  Nursing Misc Order: OK to downgrade to mod.  ONGOING         Acknowledged JOORYABI, DAVID    02/03/24 0048  Voluntary but Holdable  ONGOING         Acknowledged JOORYABI, DAVID    02/03/24 0009  diazePAM  (VALIUM ) injection 5 mg  EVERY 4 HOURS PRN         Acknowledged Nariyah Osias [facility]    02/02/24 2336  thiamine  (VITAMIN B1) injection 100 mg  ONCE         Last MAR action: Given - by REYES, ZED on 02/03/24 at 0018 Aisia Correira [facility]    02/02/24 2333  lactated ringers  bolus 1,000 mL  ONCE         Last MAR action: Stopped - by REYES, ZED on 02/03/24 at 0128 MYATT, TOBY CALVIN    02/02/24 2333  ondansetron  (ZOFRAN ) injection 4 mg  ONCE         Last MAR action: Given - by REYES, ZED on 02/03/24 at 0018 MYATT, TOBY CALVIN    02/02/24 2325  hepatitis A vaccine, adult (HAVRIX ) injection 1,440 Units  ONCE         Last MAR action: Given - by REYES, ZED on 02/03/24 at 0019 Lexie Koehl [facility]    02/02/24 2325  ECG 12 Lead  ONE TIME         Preliminary result Nasiya Pascual [facility]    02/02/24 2325  Alcohol , Ethyl  ONCE        Comments: On Admission      Final result Maico Mulvehill [facility]    02/02/24 2325  Drug Screen Rapid Panel 12 No Confirmation, Urine  ONCE        Comments: On Admission      Final result Mckinzy Fuller  [facility]    02/02/24 2325  Urinalysis  ONCE        Comments: On Admission      Final result Keshia Weare [facility]    02/02/24 2325  Denial of Rights  ONGOING         Acknowledged Reshma Hoey [facility]    02/02/24 2325  IP Consult to Psychiatry  ONE TIME        Provider:  (Not yet assigned)    Completed by MARLETA, DAVID on 02/03/2024 at  3:50 AM Willoughby Doell [facility]    02/02/24 2325  IP Consult to Case Management  ONE TIME         Acknowledged Early Ord [facility]    02/02/24 2325  IP Consult to Social Work  ONE TIME         Acknowledged Chevy Virgo QUOC    02/02/24 2325  Pulse Oximetry, Spot Check  ONE TIME         Ordered Ayden Apodaca [facility]    02/02/24 2325  Precautions: Falls  ONGOING         Acknowledged Demauri Advincula [facility]    02/02/24 2325  Precautions: Seizures  ONGOING         Acknowledged Madoline Bhatt [facility]    02/02/24 2325  Notify Provider If: If/when CIWA-AR score < 10 x 72 hours  ONGOING         Acknowledged Isaiah Cianci [facility]    02/02/24 2325  Nurse Assess: CIWA Algorithm  NOW THEN EVERY 4 HOURS         Acknowledged Caedin Mogan [facility]    02/02/24 2325  Notify Provider If: Dangerous agitation or CIWA-AR score >15 x3 consecutive hours based on scale.  ONGOING         Acknowledged Nashton Belson [facility]    02/02/24 2325  Notify: Physician  ONGOING        Comments: HOLD chlordiazepoxide  AND lorazepam IF:    * Patient has sudden decrease in mental status.   * Patient seizing.    Acknowledged Jalal Rauch [facility]    02/02/24 2325  INSERT AND MAINTAIN PERIPHERAL IV  ONE TIME         Acknowledged Burr Soffer [facility]    02/02/24 2325  chlordiazePOXIDE  (LIBRIUM ) capsule 50 mg  EVERY 1 HOUR PRN        Placed in Or Linked Group    Acknowledged Anitra Doxtater [facility]OC    02/02/24 2325  chlordiazePOXIDE  (LIBRIUM ) capsule 100 mg  EVERY 1 HOUR PRN        Placed in Or Linked Group    Acknowledged Muskaan Smet [facility]    02/02/24 2325  Phosphorus, Blood  ONCE         Final result Donnarae Rae [facility]    02/02/24 2325  Magnesium, Blood  ONCE          Final result Ameriah Lint [facility]    02/02/24 2325  CBC w/ Diff  ONCE         Final result Christell Steinmiller [facility]    02/02/24 2325  Comprehensive Metabolic Panel  ONCE         Final result Shamra Bradeen [facility]    02/02/24 2325  Lipase, Blood  ONCE         Final result Marja Adderley [facility]  02/02/24 2325  Hemogram  PROCEDURE ONCE         Final result Aasiyah Auerbach [facility]    02/02/24 2325  Differential  PROCEDURE ONCE         Final result Julita Ozbun [facility]    02/02/24 2325  Emergency Room STI Screening Panel  ONCE         In process Emberli Ballester [facility]    02/02/24 2325  Hepatitis C  Virus Antibody  PROCEDURE ONCE         Final result Raequon Catanzaro [facility]    02/02/24 2325  HIV 1 and 2 Combo Antigen and Antibody with Reflex to HIV-1/HIV-2 Antibody Differentiation  PROCEDURE ONCE         Final result Kamani Magnussen [facility]    02/02/24 2325  Syphilis Antibody with Reflex Testing  PROCEDURE ONCE         Final result Chimaobi Casebolt QUOC    02/02/24 2325  Extra Pearl  PROCEDURE ONCE         In process Lus Kriegel QU[facility]    02/02/24 2325  Extra Pearl  PROCEDURE ONCE         In process Debbra Digiulio QUOC    02/02/24 2309  Patient Monitoring: One-to-one constantly  ONGOING         Acknowledged MYATT, TOBY CALVIN    02/02/24 2325  Vital signs  PER NURSING STANDARD OF CARE      Comments: Sedation dose is based on CIWA-Ar score every 1 hour if CIWA-Ar score >= 10; then resume every 4 hour if CIWA <10.    Acknowledged Roxie Gueye QUOC             ED COURSE/PATIENT REASSESSMENTS:  ED Course is listed below that provides real time documentation during ER encounter and will provide further context to the MDM discussion above.    Workup Summary         Value Comment By Time      CBC without leukocytosis or anemia.    Mystique Bjelland Quoc, MD 09/23 [phone]      Amphetamine positive. UA negative. CMP w/o electrolyte abnormalities and kidney function normal. LFTS normal.   Cedric Mcclaine Quoc, MD 09/23 [phone]      Lipase 121, slightly elevated. Unlikely  acute pancreatitis. Americus Perkey Quoc, MD 09/23 [phone]              DIAGNOSIS:    ICD-10-CM ICD-9-CM   1. ETOH abuse  F10.10 305.00                             [1]   Past Medical History:  Diagnosis Date    Alcohol  abuse     Hypertension    [2] No past surgical history on file.  [3] No Known Allergies       Hussain Maimone Quoc, MD  Resident  02/03/24 0354       Myatt, Zada Muller, MD  02/03/24 [phone]

## 2024-02-03 DIAGNOSIS — F1024 Alcohol dependence with alcohol-induced mood disorder: Secondary | ICD-10-CM | POA: Diagnosis present

## 2024-02-03 DIAGNOSIS — F39 Unspecified mood [affective] disorder: Secondary | ICD-10-CM

## 2024-02-03 DIAGNOSIS — I10 Essential (primary) hypertension: Secondary | ICD-10-CM

## 2024-02-03 DIAGNOSIS — R079 Chest pain, unspecified: Secondary | ICD-10-CM

## 2024-02-03 DIAGNOSIS — F419 Anxiety disorder, unspecified: Secondary | ICD-10-CM

## 2024-02-03 LAB — DRUG SCREEN RAPID PANEL 12 NO CONFIRMATION, URINE - ~~LOC~~
Amphetamines Scrn, UR: POSITIVE — AB
Barbiturate Scrn, UR: NOT DETECTED
Benzodiazepine Scrn, UR: NOT DETECTED
Cocaine Scrn, UR: NOT DETECTED
Fentanyl Scrn, UR: NOT DETECTED
MDMA (Ecstasy) Scrn, UR: NOT DETECTED
Methadone Scrn, UR: NOT DETECTED
Opiate Scrn, UR: NOT DETECTED
Oxycodone Scrn, UR: NOT DETECTED
PCP Scrn, UR: NOT DETECTED
Propoxyphene (Darvon) Scrn, UR: NOT DETECTED
THC Scrn, UR: NOT DETECTED

## 2024-02-03 LAB — HEMOGRAM, BLOOD - ~~LOC~~
HCT: 44 % (ref 39.5–50.0)
HGB: 15.1 g/dL (ref 13.5–16.9)
MCH: 30.2 pg (ref 27.0–33.5)
MCHC: 34.3 g/dL (ref 32.0–35.5)
MCV: 88 fl (ref 81.5–97.0)
MPV: 7.3 fl (ref 7.2–11.7)
PLT Count: 255 thous/mcl (ref 150–400)
RBC: 5 mill/mcl (ref 4.38–5.62)
RDW-CV: 14.2 % (ref 11.6–14.4)
WBC: 6.8 thous/mcL (ref 4.0–10.5)

## 2024-02-03 LAB — URINALYSIS - ~~LOC~~
Bilirubin: NEGATIVE
Glucose: NEGATIVE mg/dL
Hemoglobin, Urine: NEGATIVE
Ketones, Urine: NEGATIVE mg/dL
Leukocyte Esterase: NEGATIVE
Nitrite: NEGATIVE
Protein: NEGATIVE mg/dL
RBC: <1 /HPF (ref 0–3)
Specific Gravity, Urine: 1.01 (ref 1.003–1.030)
Squamous Epithelial: 0 /LPF (ref 0–10)
Urobilinogen: <2 mg/dL (ref <2)
WBC: 1 /HPF (ref <=5)
pH, Urine: 5.5 (ref 5.0–8.0)

## 2024-02-03 LAB — COMPREHENSIVE METABOLIC PANEL, BLOOD - ~~LOC~~
ALT: 20 U/L (ref 7–52)
AST: 27 U/L (ref 13–39)
Albumin: 4.3 g/dL (ref 4.2–5.5)
Alkaline Phosphatase: 111 U/L — ABNORMAL HIGH (ref 34–104)
BUN: 13 mg/dL (ref 7–25)
Bilirubin, Total: 0.4 mg/dL (ref <=1.4)
Calcium: 8.5 mg/dL — ABNORMAL LOW (ref 8.6–10.3)
Carbon Dioxide: 22 mmol/L (ref 21–31)
Chloride: 105 mmol/L (ref 98–107)
Creatinine: 0.8 mg/dL (ref 0.7–1.3)
Electrolyte Balance: 12 mmol/L (ref 2–12)
Glucose: 113 mg/dL (ref 85–125)
Potassium: 3.5 mmol/L (ref 3.5–5.1)
Protein, Total: 7.4 g/dL (ref 6.0–8.3)
Sodium: 139 mmol/L (ref 136–145)
eGFR: 118 mL/min/1.73 sq mtr (ref >=60)

## 2024-02-03 LAB — DIFFERENTIAL - ~~LOC~~
Basophils %: 1.5 % (ref 0.0–2.0)
Basophils Absolute: 0.1 thous/mcl (ref 0.0–0.2)
Eosinophils %: 2.5 % (ref 0.0–7.0)
Eosinophils Absolute: 0.2 thous/mcl (ref 0.0–0.5)
Lymphocytes %: 43.7 % (ref 14.0–52.0)
Lymphocytes Absolute: 3 thous/mcl (ref 0.9–3.3)
Monocytes %: 7.8 % (ref 1.0–11.0)
Monocytes Absolute: 0.5 thous/mcl (ref 0.0–0.8)
Neutrophils %: 44.5 % (ref 39.0–88.0)
Neutrophils Absolute: 3 thous/mcl (ref 2.0–8.1)

## 2024-02-03 LAB — ECG 12-LEAD
ATRIAL RATE: 82 ms
Heart Rate: 83 {beats}/min
P Front Axis: 18 deg
PR INTERVAL: 181 ms
QRS AXIS: -25 deg
QRSD Interval: 94 ms
QT Interval: 366 ms
QTcF: 408 ms
RR Interval: 728 ms
T Wave Axis: 45 deg

## 2024-02-03 LAB — ALCOHOL, ETHYL - ~~LOC~~: Alcohol, Ethyl: 328 mg/dL — ABNORMAL HIGH

## 2024-02-03 LAB — HIV 1/2 ANTIBODY & P24 ANTIGEN ASSAY, BLOOD (PERFORMABLE) - UCIH: HIV-1+2 Antibodies And HIV-1 P24 Antigen Screen: NONREACTIVE

## 2024-02-03 LAB — SYPHILIS AB WITH REFLEX TESTING, BLOOD - ~~LOC~~: T. Pallidum Antibody: NONREACTIVE

## 2024-02-03 LAB — MAGNESIUM, BLOOD - ~~LOC~~: Magnesium: 2.4 mg/dL (ref 1.9–2.7)

## 2024-02-03 LAB — PHOSPHORUS, BLOOD - ~~LOC~~: Phosphorus: 4.1 mg/dL (ref 2.5–5.0)

## 2024-02-03 LAB — LIPASE, BLOOD - ~~LOC~~: Lipase: 121 U/L — ABNORMAL HIGH (ref 11–82)

## 2024-02-03 LAB — HEPATITIS C VIRUS AB, BLOOD - ~~LOC~~: Hepatitis C Antibody: NONREACTIVE

## 2024-02-03 MED ORDER — DIAZEPAM 5 MG/ML IJ SOLN
5.0000 mg | INTRAMUSCULAR | Status: DC | PRN
Start: 2024-02-03 — End: 2024-02-03

## 2024-02-03 MED ORDER — GABAPENTIN 300 MG OR CAPS
300.0000 mg | ORAL_CAPSULE | Freq: Three times a day (TID) | ORAL | Status: DC
Start: 2024-02-03 — End: 2024-02-03
  Administered 2024-02-03 (×2): 300 mg via ORAL
  Filled 2024-02-03 (×2): qty 1

## 2024-02-03 NOTE — ED Notes (Signed)
 Patient cleared for discharge. Called spouse, Sherrell, listed in demographics regarding discharge - she did not answer, author left voicemail.

## 2024-02-03 NOTE — Interdisciplinary (Signed)
 Social Work Assessment        Patient Name:  Corey [name]   MRN: [id number]   Date of Birth: 1971/03/02    Age: 53 year old   Date of Admission:  02/02/2024         Service Date: February 03, 2024     Assessment  Assessment Type: (P) Discharge;Face to Face;Verbal    Referral Information  Consult Type: (P) Community Resources/Referrals;Discharge Planning;Homeless;Verbal   Social Assessment  Living Arrangements on Admission*: (P) Homeless  Post Acute Services Referred To: (P) TBD  Post Acute Resources Provided: (P) Behavioral Health  A List of Production designer, theatre/television/film Provided: (P) Yes  Weather Appropriate Clothing Provided: (P) No - Systems developer Referral: (P) Not Applicable  Primary Care Provider Contacted (Behavioral Health Only): (P) None Known, Referral Provided  Meal Provided Prior to Discharge: (P) Yes  Discharge Medications: (P) None Prescribed  Patient Reported Discharge Destination: (P) Other (Comment) (Prior living arrangement)  Homeless Discharge Documentation Complete : (P) CM/SW Completed  Has discharge transport been arranged?: (P) Yes  Discharge Transportation Details * : (P) Spouse at bedside confirmed will assist w/ d/c transportation  Transportation Company/Phone Number * : (P) N/A  Transportation* : (P) Family/Friend  Patient Engaged in Discharge Planning *: (P) Yes         Per Psychiatry recommendations, pt no longer meets criteria for inpatient psychiatric hospitalization and is to be cleared for d/c from the ED today. Of note, [facility] met with pt and provided appropriate SUD tx resources to pt. CSW consulted for psych placement/transfer; however, psych placement no longer indicated. CSW met with pt and pt's spouse at bedside for d/c planning. Per spouse, will provide d/c transportation when pt is cleared for d/c. Pt provided with community resources, including [place] [facility] Pocket Guide, [facility] services referral line, 211 flyer, [place] adults unhoused  access points list, etc. Pt accepting of all resources provided. Pt discharging to prior living arrangement. No further SW needs identified at this time.     CHARM Duos, LCSW  Clinical Social Worker III  Emergency Department

## 2024-02-03 NOTE — ED Notes (Signed)
 Author able to contact spouse, Sherrell, she states that she is en route to hospital to pick up patient.
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
## 2024-02-03 NOTE — Discharge Instructions (Signed)
 Detox Resources   If you are withdrawing from alcohol  or benzodiazepines (Xanax , Ativan, etc.), please go to any Emergency Department for medical support.   Medical Detox:   [facility]   [address]   [place], [place] [address]   [phone]     [facility]   [address]   [place], [place] [address]   [phone]   [phone]         [facility] requires recent drug or alcohol  use within the past 24 to 48 hours to qualify for detox.      [facility] Withdrawal Management [facility]   The withdrawal management program provides detoxification services in a residential, non-medical setting that follows the social model of detoxification. Expert staff provides safe, 24-hour monitoring, observation, and support in a supervised environment so residents can achieve initial recovery from the effects of alcohol  or another substance.      Walk-ins are not accepted at the [facility] campus; please call the Intake and Referral Center at [phone] for assistance. Intake hours are from 7:30am to 3:00pm.     Social Detox:      [facility]   [address]   [place], [place] [address]   [phone]     [facility]   [address]   [place], [place] [address]   [phone]   Daily bed admissions start at 8:00am; please call for availability.       [facility]   [address]   [place], [place] [address]   [phone]   Daily bed admissions start at 6:00am; please call for availability.     [facility]   [phone]   [place], [place] [address]   [phone]   Daily bed admissions start at 8:00am; please call for availability.    [address] ([facility])   [place], [place]   [phone]   For men only   Daily bed admissions start at 7:00am; please call for availability.  [facility]   [address]   [place], [place] [address]   Withdrawal Management: [phone]    General Information: [phone]    Daily bed admissions are from  7:30am-3:00pm; please call for availability.         Residential Treatment   Medi-Cal Beneficiary Access Line   [phone]   To receive a phone screening and appointment for residential treatment, please call the Medi-Cal Beneficiary Access Line. They are available 24/7 for referrals to treatment.      Assessment for Residential Treatment (ART) Team   [address] [facility]   [place], [place] [address]   [phone]      [facility]   [address]   [place], [place] [address]   [phone]   Program Intake Hours 8:00am - 4:00pm, Monday - Friday    No insurance required; you must be able to work eight-hour shifts and pass a drug screening during intake.       Cessation Support Groups      Alcoholics Anonymous [facility]   www.oc-aa.org/meetings   [phone]     Alcoholics Anonymous    WirelessSchedule.co.za.php   [phone]    LifeRing   http://www.greer-clements.com/   [phone]     SMART Recovery   www.smartrecovery.org/meeting   [phone]    Moderation Management   www.moderation.org/events     Women For  Sobriety   https://dixon.info/       Al-Anon and [facility]   www.ocalanon.org/meeting-directory     Al-Anon and [employer].alanonla.vickey   [phone]       Narcotics Anonymous [facility]   SkateboardingCourses.com.cy   24-hour helpline: [phone]     [facility] Anonymous   ClassMovie.be   [phone]    Narcotics Anonymous [facility]   www.BuildHer.es     24-Hour Narcotics Anonymous Live Helpline   [phone]   En espaol: [phone]         Intensive Outpatient Programs (IOPs)   Intensive Outpatient Programs offer a higher level of care than traditional outpatient therapy,   but are less intensive than residential treatment.      [facility]   [address]   [place], [place] [address]   [phone]     [facility]   [address]   [place], [place] [address]   [phone]   Accepts adolescents       [facility]   [address]   [place], [place] [address]   [phone]  [facility]   [address]   [place], [place] [address]   [phone]   Accepts adolescents       [facility]   [address]   [place], [place] [address]   [phone]   Accepts adolescents     [facility]   [address]   [place], [place] [address]   [phone]   Accepts adolescents       [facility]   [address] [place], [place] [address]   [phone]     [facility]   [address]   [place], [place] [address]   [phone]       [facility]   [address]   [place], [place] [address]   [phone]     [facility]   [address]   [place], [place] [address]   [phone]    [facility]   [address] [place], [place] [address]   [phone]   **For women age 78-30**           To arrange free, non-emergency transportation to and from your appointment, please call [employer] at [phone] at least three days ahead of your appointment. They accept ride requests Monday through Friday.

## 2024-02-03 NOTE — Interdisciplinary (Addendum)
 SUBSTANCE USE NAVIGATOR                                                        INITIAL ASSESSMENT     Date of Visit 02/03/2024     Note Date and Time 02/03/2024, 11:08 AM    Referred By  MD Juliane    Reason for Referral  Substance Use Concerns and Substance Use Disorder Treatment Options    Language of Care English    Patient Contact Information Phone: [phone] (home)   Address: [address]  [place] [address]    Admission Diagnosis          Demographics     Patient Name  Corey [name]    D.O.B./Age  June 03, 1970, 53 year old     Gender/Ethnicity     male   Race:White    Identifies as          10/07/2020     6:04 AM   AUDIT from Brief Health Screen   Q1: How often do you have a drink containing alcohol ? Pt Declined   AUDIT-C Total Score 0        Currently Intoxicated  No    History of Withdrawal Symptoms  Yes: on CIWA, receiving Librium     Last Use  Arrived intoxicated    Alcohol  Type  Beer, Wine, and Liquor    Environment Typically Uses   Alone    Attempts to Quit   Other: Did not discuss    Longest Period of Sobriety  1.5 years, in jail                  10/07/2020     6:04 AM   DAST from Brief Health Screen   How many times in the past year have you used a recreational drug or used a prescription medication for non-medical reasons? None       Currently Intoxicated  No    History of Withdrawal Symptoms  Yes: Previously on Suboxone, no concerns for withdrawal currently    Intravenous drug use current and past  Unable to assess: did not assess    Last Drug Use   Prior to entering jail 1.5 years ago    Environment Typically Use  Alone    Attempts to Quit  Other: Jail; Suboxone    Longest Period of Sobriety  1.5 years    Start on Naltrexone   No    Started Buprenorphine   No, declined     Lab Results   Component Value Date    [facility]  10/17/2020     Screening results are presumptive and should be interpreted with caution in conjunction with other clinical information.    OPIP UNDETECTED 10/17/2020     COCP UNDETECTED 10/17/2020    PCP Undetected 02/03/2024    AMPHETAMINE UNDETECTED 10/17/2020    THCCP UNDETECTED 10/17/2020    METHADONE UNDETECTED 10/17/2020    PROPOXYPHENE UNDETECTED 10/17/2020    BENP UNDETECTED 10/17/2020    BARBI UNDETECTED 10/17/2020    FEN Undetected 02/03/2024    RDSICC Drugs Covered and Cutoff Concentration: 10/17/2020         Interventions     Interventions    Consulted with medical team, Developed plan for substance abuse treatment, and Discussed the overall severity of  the problem    Counseling Provided  No    Referral to Alcohol  rehab program  Yes           Plan of Action      Substance Use Navigator (SUN) received consult request for patient [facility] re: alcohol  use disorder resources, navigation to treatment. Per chart review, patient on legal hold for psych concerns; BAL+ 328 on arrival around midnight last night. Patient on CIWA, receiving Librium  for withdrawals. Per discussion with psych MD-R Juliane, dispo pending psych re-eval.    AUSTIN Pagan met with patient at bedside, introduced self, roles and reason for visit. Patient shared that he has been binge drinking and fears he will continue binge drinking if discharged. SUN and patient discussed entering a detox program if cleared by psych; patient open and willing to pursue detox program. SUN discussed the possibility that there will be no available beds and patient is amenable to discharging home with a list of detox programs to call in the morning.    Patient was previously on Suboxone in jail but denies experiencing opioid withdrawal or needing MAT at this time.    SUN to remain on case, will work with patient to get into detox if he is not going to inpatient psych.    1200: SUN asked to reengage patient for detox bed search. SUN called [facility] and [facility], no beds. Patient decided he would prefer to follow up with [facility] and discharge home. Team updated.    No further needs identified.           Electronically Signed by:   Alp Goldwater, MPH [facility]    Substance Use Navigator  [phone]

## 2024-02-03 NOTE — ED Notes (Signed)
 Full diagnostics / imaging / plan of care provided to alex RN to bgu 2. All questions answered to receiving RN. RN aware and agreeable to plan of care and to provide continuity of care

## 2024-02-03 NOTE — ED Notes (Signed)
 Patient's spouse, Sherrell, allowed to be at bedside per patient. Social Worker Comptroller providing resources for follow up care.

## 2024-02-04 LAB — EXTRA PEARL (LIO) (PERFORMABLE) - ~~LOC~~

## 2024-02-05 NOTE — Interdisciplinary (Signed)
 02/05/24 1406   Follow Up Appointments   PCP None   Follow Up Appointment Calls Initial Call #1   Appointment Made? No   Reason why appointment not scheduled Patient will schedule   Appointment Notes ATTEMPT 1: NO PCP, UNABLE TO REACH PT, LETTER SENT

## 2024-02-21 ENCOUNTER — Emergency Department

## 2024-02-21 ENCOUNTER — Emergency Department: Admission: AC | Admit: 2024-02-21 | Discharge: 2024-02-22 | Disposition: A

## 2024-02-21 DIAGNOSIS — T1490XA Injury, unspecified, initial encounter: Secondary | ICD-10-CM

## 2024-02-21 DIAGNOSIS — Z043 Encounter for examination and observation following other accident: Secondary | ICD-10-CM

## 2024-02-21 DIAGNOSIS — S80212A Abrasion, left knee, initial encounter: Secondary | ICD-10-CM | POA: Insufficient documentation

## 2024-02-21 DIAGNOSIS — S01312A Laceration without foreign body of left ear, initial encounter: Secondary | ICD-10-CM

## 2024-02-21 DIAGNOSIS — S062X9A Diffuse traumatic brain injury with loss of consciousness of unspecified duration, initial encounter: Secondary | ICD-10-CM

## 2024-02-21 DIAGNOSIS — S20212A Contusion of left front wall of thorax, initial encounter: Secondary | ICD-10-CM | POA: Insufficient documentation

## 2024-02-21 DIAGNOSIS — S098XXA Other specified injuries of head, initial encounter: Secondary | ICD-10-CM

## 2024-02-21 DIAGNOSIS — M542 Cervicalgia: Secondary | ICD-10-CM

## 2024-02-21 DIAGNOSIS — S0240DA Maxillary fracture, left side, initial encounter for closed fracture: Secondary | ICD-10-CM | POA: Insufficient documentation

## 2024-02-21 DIAGNOSIS — S0240FA Zygomatic fracture, left side, initial encounter for closed fracture: Secondary | ICD-10-CM

## 2024-02-21 DIAGNOSIS — Y92149 Unspecified place in prison as the place of occurrence of the external cause: Secondary | ICD-10-CM | POA: Insufficient documentation

## 2024-02-21 DIAGNOSIS — S80211A Abrasion, right knee, initial encounter: Secondary | ICD-10-CM | POA: Insufficient documentation

## 2024-02-21 DIAGNOSIS — S3991XA Unspecified injury of abdomen, initial encounter: Secondary | ICD-10-CM

## 2024-02-21 DIAGNOSIS — S2241XA Multiple fractures of ribs, right side, initial encounter for closed fracture: Secondary | ICD-10-CM

## 2024-02-21 DIAGNOSIS — M47814 Spondylosis without myelopathy or radiculopathy, thoracic region: Secondary | ICD-10-CM | POA: Insufficient documentation

## 2024-02-21 DIAGNOSIS — E872 Acidosis, unspecified: Secondary | ICD-10-CM | POA: Insufficient documentation

## 2024-02-21 DIAGNOSIS — H1131 Conjunctival hemorrhage, right eye: Secondary | ICD-10-CM | POA: Insufficient documentation

## 2024-02-21 DIAGNOSIS — S0003XA Contusion of scalp, initial encounter: Secondary | ICD-10-CM

## 2024-02-21 DIAGNOSIS — S02842A Fracture of lateral orbital wall, left side, initial encounter for closed fracture: Secondary | ICD-10-CM

## 2024-02-21 DIAGNOSIS — R008 Other abnormalities of heart beat: Secondary | ICD-10-CM

## 2024-02-21 DIAGNOSIS — S00432A Contusion of left ear, initial encounter: Secondary | ICD-10-CM | POA: Insufficient documentation

## 2024-02-21 DIAGNOSIS — M48061 Spinal stenosis, lumbar region without neurogenic claudication: Secondary | ICD-10-CM | POA: Insufficient documentation

## 2024-02-21 DIAGNOSIS — I3481 Nonrheumatic mitral (valve) annulus calcification: Secondary | ICD-10-CM | POA: Insufficient documentation

## 2024-02-21 DIAGNOSIS — M47816 Spondylosis without myelopathy or radiculopathy, lumbar region: Secondary | ICD-10-CM | POA: Insufficient documentation

## 2024-02-21 DIAGNOSIS — I2510 Atherosclerotic heart disease of native coronary artery without angina pectoris: Secondary | ICD-10-CM | POA: Insufficient documentation

## 2024-02-21 LAB — ECG 12-LEAD
ATRIAL RATE: 74 ms
Heart Rate: 74 {beats}/min
P Front Axis: 60 deg
PR INTERVAL: 166 ms
QRS AXIS: -21 deg
QRSD Interval: 81 ms
QT Interval: 358 ms
QTcF: 384 ms
RR Interval: 808 ms
T Wave Axis: 60 deg

## 2024-02-21 LAB — HEMOGRAM, BLOOD - ~~LOC~~
HCT: 42.1 % (ref 39.5–50.0)
HGB: 14 g/dL (ref 13.5–16.9)
MCH: 31.3 pg (ref 27.0–33.5)
MCHC: 33.3 g/dL (ref 32.0–35.5)
MCV: 94.1 fl (ref 81.5–97.0)
MPV: 7.9 fl (ref 7.2–11.7)
PLT Count: 276 thous/mcl (ref 150–400)
RBC: 4.48 mill/mcl (ref 4.38–5.62)
RDW-CV: 18.9 % — ABNORMAL HIGH (ref 11.6–14.4)
WBC: 11.3 thous/mcL — ABNORMAL HIGH (ref 4.0–10.5)

## 2024-02-21 LAB — BASIC METABOLIC PANEL, BLOOD - ~~LOC~~
BUN: 15 mg/dL (ref 7–25)
Calcium: 8.9 mg/dL (ref 8.6–10.3)
Carbon Dioxide: 25 mmol/L (ref 21–31)
Chloride: 107 mmol/L (ref 98–107)
Creatinine: 1 mg/dL (ref 0.7–1.3)
Electrolyte Balance: 8 mmol/L (ref 2–12)
Glucose: 119 mg/dL (ref 85–125)
Potassium: 4.4 mmol/L (ref 3.5–5.1)
Sodium: 140 mmol/L (ref 136–145)
eGFR: 89 mL/min/1.73 sq mtr (ref >=60)

## 2024-02-21 LAB — TYPE AND SCREEN (PRETRANSFUSION TESTING) - ~~LOC~~
ABO/Rh(D): A POS
Antibody Screen Result: NEGATIVE
Units Ordered: 0

## 2024-02-21 LAB — GLUCOSE, POINT OF CARE - ~~LOC~~: Glucose POC: 109 ng/mL (ref 85–125)

## 2024-02-21 LAB — PT/INR/PTT - ~~LOC~~
INR: 0.98 (ref 0.88–1.12)
PTT: 24.8 s — ABNORMAL LOW (ref 25.1–36.5)
Prothrombin Time: 11.4 s (ref 10.2–12.9)

## 2024-02-21 LAB — URINALYSIS - ~~LOC~~
Bilirubin: NEGATIVE
Glucose: NEGATIVE mg/dL
Ketones, Urine: NEGATIVE mg/dL
Leukocyte Esterase: NEGATIVE
Nitrite: NEGATIVE
Protein: NEGATIVE mg/dL
RBC: 3 /HPF (ref 0–3)
Specific Gravity, Urine: 1.008 (ref 1.003–1.030)
Squamous Epithelial: 0 /LPF (ref 0–10)
Urobilinogen: <2 mg/dL (ref <2)
WBC: <1 /HPF (ref <=5)
pH, Urine: 7 (ref 5.0–8.0)

## 2024-02-21 LAB — DIFFERENTIAL - ~~LOC~~
Basophils %: 1.1 % (ref 0.0–2.0)
Basophils Absolute: 0.1 thous/mcl (ref 0.0–0.2)
Eosinophils %: 0.8 % (ref 0.0–7.0)
Eosinophils Absolute: 0.1 thous/mcl (ref 0.0–0.5)
Lymphocytes %: 30.9 % (ref 14.0–52.0)
Lymphocytes Absolute: 3.5 thous/mcl — ABNORMAL HIGH (ref 0.9–3.3)
Monocytes %: 6.4 % (ref 1.0–11.0)
Monocytes Absolute: 0.7 thous/mcl (ref 0.0–0.8)
Neutrophils %: 60.8 % (ref 39.0–88.0)
Neutrophils Absolute: 6.9 thous/mcl (ref 2.0–8.1)

## 2024-02-21 LAB — DRUG SCREEN RAPID PANEL 12 NO CONFIRMATION, URINE - ~~LOC~~
Amphetamines Scrn, UR: NOT DETECTED
Barbiturate Scrn, UR: NOT DETECTED
Benzodiazepine Scrn, UR: POSITIVE — AB
Cocaine Scrn, UR: NOT DETECTED
Fentanyl Scrn, UR: NOT DETECTED
MDMA (Ecstasy) Scrn, UR: NOT DETECTED
Methadone Scrn, UR: NOT DETECTED
Opiate Scrn, UR: NOT DETECTED
Oxycodone Scrn, UR: NOT DETECTED
PCP Scrn, UR: NOT DETECTED
Propoxyphene (Darvon) Scrn, UR: NOT DETECTED
THC Scrn, UR: NOT DETECTED

## 2024-02-21 LAB — ALCOHOL, ETHYL - ~~LOC~~: Alcohol, Ethyl: NOT DETECTED

## 2024-02-21 LAB — HIV 1/2 ANTIBODY & P24 ANTIGEN ASSAY, BLOOD (PERFORMABLE) - UCIH: HIV-1+2 Antibodies And HIV-1 P24 Antigen Screen: NONREACTIVE

## 2024-02-21 LAB — LACTATE, BLOOD - ~~LOC~~: Lactic Acid: 2.8 mmol/L — ABNORMAL HIGH (ref 0.5–2.0)

## 2024-02-21 LAB — TROPONIN I, HIGH SENSITIVITY - ~~LOC~~: Troponin I, High Sensitivity: <3 ng/L (ref 0–20)

## 2024-02-21 LAB — SYPHILIS AB WITH REFLEX TESTING, BLOOD - ~~LOC~~: T. Pallidum Antibody: NONREACTIVE

## 2024-02-21 LAB — HEMOGLOBIN, HEMOCUE POINT OF CARE TESTING - ~~LOC~~: Hem, Hemocue POC: 13.5 g/dL (ref 7.0–21.0)

## 2024-02-21 LAB — ABO/RH (D) RECHECK - LAB USE ONLY (LIO)  - ~~LOC~~: ABO/Rh(D): A POS

## 2024-02-21 LAB — HEPATITIS C VIRUS AB, BLOOD - ~~LOC~~: Hepatitis C Antibody: NONREACTIVE

## 2024-02-21 LAB — CPK-CREATINE PHOSPHOKINASE, BLOOD - ~~LOC~~: Creatine Kinase: 78 U/L (ref 30–223)

## 2024-02-21 MED ORDER — LIDOCAINE-EPINEPHRINE 2 %-1:100000 IJ SOLN
10.0000 mL | Freq: Once | INTRAMUSCULAR | Status: AC
Start: 2024-02-21 — End: 2024-02-21
  Administered 2024-02-21: 10 mL via SUBCUTANEOUS
  Filled 2024-02-21: qty 20

## 2024-02-21 MED ORDER — SODIUM CHLORIDE 0.9 % IJ SOLN (CUSTOM)
50.0000 mL | Freq: Once | INTRAMUSCULAR | Status: DC | PRN
Start: 2024-02-21 — End: 2024-02-22

## 2024-02-21 MED ORDER — ACETAMINOPHEN 10 MG/ML IV SOLN
1000.0000 mg | Freq: Three times a day (TID) | INTRAVENOUS | Status: DC
Start: 2024-02-21 — End: 2024-02-22
  Administered 2024-02-21 – 2024-02-22 (×2): 1000 mg via INTRAVENOUS
  Filled 2024-02-21 (×2): qty 100

## 2024-02-21 MED ORDER — SODIUM CHLORIDE FLUSH 0.9 % IV SOLN
10.0000 mL | Freq: Once | INTRAVENOUS | Status: DC | PRN
Start: 2024-02-21 — End: 2024-02-22

## 2024-02-21 MED ORDER — HYDROMORPHONE HCL 2 MG/ML IJ SOLN
0.5000 mg | Freq: Once | INTRAMUSCULAR | Status: AC
Start: 2024-02-21 — End: 2024-02-21
  Administered 2024-02-21: 0.5 mg via INTRAVENOUS
  Filled 2024-02-21: qty 1

## 2024-02-21 MED ORDER — FENTANYL CITRATE (PF) 100 MCG/2ML IJ SOLN
INTRAMUSCULAR | Status: AC | PRN
Start: 2024-02-21 — End: 2024-02-21
  Administered 2024-02-21: 50 ug via INTRAVENOUS

## 2024-02-21 MED ORDER — SODIUM CHLORIDE 0.9 % IV SOLN
Freq: Once | INTRAVENOUS | Status: AC
Start: 2024-02-21 — End: 2024-02-22

## 2024-02-21 MED ORDER — IOHEXOL 350 MG/ML CO SOLN
100.0000 mL | Freq: Once | Status: AC
Start: 2024-02-21 — End: 2024-02-21
  Administered 2024-02-21: 100 mL via INTRAVENOUS

## 2024-02-21 MED ORDER — IOHEXOL 350 MG/ML CO SOLN
75.0000 mL | Freq: Once | Status: AC
Start: 2024-02-21 — End: 2024-02-21
  Administered 2024-02-21: 75 mL via INTRAVENOUS

## 2024-02-21 MED ORDER — FENTANYL CITRATE (PF) 100 MCG/2ML IJ SOLN
INTRAMUSCULAR | Status: DC
Start: 2024-02-21 — End: 2024-02-22
  Filled 2024-02-21: qty 2

## 2024-02-21 MED ORDER — MORPHINE SULFATE 4 MG/ML IJ SOLN
4.0000 mg | INTRAMUSCULAR | Status: DC | PRN
Start: 2024-02-21 — End: 2024-02-22
  Administered 2024-02-22 (×3): 4 mg via INTRAVENOUS
  Filled 2024-02-21 (×3): qty 1

## 2024-02-21 NOTE — ED Notes (Signed)
 Patient transported to CT via ACLS.
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
## 2024-02-21 NOTE — ED Notes (Signed)
 Dr. Newell and Dr. Kandi raker regarding pt c/o and crying of pain. Requesting more pain meds.
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
## 2024-02-21 NOTE — ED Notes (Signed)
ENT at bedside for lac repair.
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
## 2024-02-21 NOTE — ED Notes (Signed)
 EKG reviewed by Dr. Newell (A) no new orders.
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
## 2024-02-22 ENCOUNTER — Other Ambulatory Visit: Payer: Self-pay

## 2024-02-22 DIAGNOSIS — H43391 Other vitreous opacities, right eye: Secondary | ICD-10-CM

## 2024-02-22 DIAGNOSIS — S0240DA Maxillary fracture, left side, initial encounter for closed fracture: Secondary | ICD-10-CM

## 2024-02-22 LAB — EXTRA RED - ~~LOC~~

## 2024-02-22 LAB — EXTRA PEARL (LIO) (PERFORMABLE) - ~~LOC~~

## 2024-02-22 LAB — LACTATE, BLOOD - ~~LOC~~: Lactic Acid: 1.3 mmol/L (ref 0.5–2.0)

## 2024-02-22 MED ORDER — MORPHINE SULFATE 15 MG OR TABS
ORAL_TABLET | ORAL | Status: DC
Start: 2024-02-22 — End: 2024-02-22
  Filled 2024-02-22: qty 1

## 2024-02-22 MED ORDER — MORPHINE SULFATE 15 MG OR TABS
15.0000 mg | ORAL_TABLET | Freq: Once | ORAL | Status: AC
Start: 2024-02-22 — End: 2024-02-22
  Administered 2024-02-22: 15 mg via ORAL

## 2024-02-22 MED ORDER — NAPROXEN 500 MG OR TABS
500.0000 mg | ORAL_TABLET | Freq: Two times a day (BID) | ORAL | 0 refills | Status: AC | PRN
Start: 2024-02-22 — End: ?

## 2024-02-22 MED ORDER — CIPROFLOXACIN HCL 500 MG OR TABS
500.0000 mg | ORAL_TABLET | Freq: Two times a day (BID) | ORAL | 0 refills | Status: AC
Start: 2024-02-22 — End: 2024-03-03

## 2024-02-22 MED ORDER — HYDROCODONE-ACETAMINOPHEN 5-325 MG OR TABS
1.0000 | ORAL_TABLET | Freq: Four times a day (QID) | ORAL | 0 refills | Status: AC | PRN
Start: 2024-02-22 — End: 2024-05-22

## 2024-02-22 MED ORDER — CIPROFLOXACIN HCL 500 MG OR TABS
500.0000 mg | ORAL_TABLET | Freq: Two times a day (BID) | ORAL | 0 refills | Status: DC
Start: 2024-02-22 — End: 2024-02-22
  Filled 2024-02-22: qty 20, 10d supply, fill #0

## 2024-02-22 MED ORDER — ACETAMINOPHEN 325 MG PO TABS
975.0000 mg | ORAL_TABLET | Freq: Once | ORAL | Status: AC
Start: 2024-02-22 — End: 2024-02-22
  Administered 2024-02-22: 975 mg via ORAL
  Filled 2024-02-22: qty 3

## 2024-02-22 NOTE — Event / Update (Signed)
 ImPACT Tool Composite Scores  Attention Tracker: 1 %  Motor Speed: 44 %  Memory: 100 %    - Cog eval/ImPACT - if Memory score on ImPACT < 3% please perform formal cog eval with SLP; if 3-15% please refer to Concussion Clinic; OK for discharge if > 15%

## 2024-02-22 NOTE — ED Notes (Signed)
 Pt resting in gurney, respirations are even and unlabored, he is speaking in full sentences, supplies given to clean dry blood, meal tray given, pt able to eat independently.
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
## 2024-02-22 NOTE — ED Notes (Signed)
 The patient tolerated PO intake well.

## 2024-02-22 NOTE — Discharge Instructions (Addendum)
 You have been evaluated in the Emergency Department today.     You will need to follow-up with your Primary Care Physician in 3-5 days time for repeat evaluation, or sooner if necessary. The follow-up appointment is a very important part of your ongoing medical evaluation and management.    Bolster dressing placed. To be removed in ENT clinic in roughly 1 week. Referral placed and clinic messaged.     If you are feeling worse or develop any new or concerning symptoms such as worsening headache, neck pain, dizziness, confusion, blurry vision, abdominal pain, worsening back pain, weakness or numbness in lower extremities, or anything else that concerns you return to the emergency department immediately.

## 2024-02-22 NOTE — ED Notes (Signed)
 PD at bedside.
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
## 2024-02-22 NOTE — ED Notes (Signed)
 Dr. Ivanova at bedside to clear the patient from C-collar.

## 2024-02-27 ENCOUNTER — Ambulatory Visit

## 2024-03-25 ENCOUNTER — Ambulatory Visit: Admitting: Student in an Organized Health Care Education/Training Program

## 2024-03-25 ENCOUNTER — Telehealth: Payer: Self-pay

## 2024-03-25 DIAGNOSIS — H43391 Other vitreous opacities, right eye: Secondary | ICD-10-CM | POA: Insufficient documentation

## 2024-03-25 NOTE — Telephone Encounter (Signed)
 Lildia called [facility] to ask Dr.Rafaelof for report from 11/13 today's visit to be faxed to [phone].    Please Assist.
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
## 2024-03-26 NOTE — Telephone Encounter (Signed)
 Fax office visit from 03/26/2023 to [phone].

## 2024-04-16 ENCOUNTER — Emergency Department: Admission: EM | Admit: 2024-04-16 | Discharge: 2024-04-16 | Disposition: A | Discharge status: Still In Hospital

## 2024-04-16 DIAGNOSIS — F1090 Alcohol use, unspecified, uncomplicated: Secondary | ICD-10-CM

## 2024-04-16 DIAGNOSIS — R45851 Suicidal ideations: Secondary | ICD-10-CM

## 2024-04-16 DIAGNOSIS — F39 Unspecified mood [affective] disorder: Secondary | ICD-10-CM | POA: Insufficient documentation

## 2024-04-16 LAB — DRUG SCREEN RAPID PANEL 12 NO CONFIRMATION, URINE - ~~LOC~~
Amphetamines Scrn, UR: NOT DETECTED
Barbiturate Scrn, UR: NOT DETECTED
Benzodiazepine Scrn, UR: NOT DETECTED
Cocaine Scrn, UR: NOT DETECTED
Fentanyl Scrn, UR: NOT DETECTED
MDMA (Ecstasy) Scrn, UR: NOT DETECTED
Methadone Scrn, UR: NOT DETECTED
Opiate Scrn, UR: NOT DETECTED
Oxycodone Scrn, UR: NOT DETECTED
PCP Scrn, UR: NOT DETECTED
Propoxyphene (Darvon) Scrn, UR: NOT DETECTED
THC Scrn, UR: NOT DETECTED

## 2024-04-16 LAB — COMPREHENSIVE METABOLIC PANEL, BLOOD - ~~LOC~~
ALT: 14 U/L (ref 7–52)
AST: 22 U/L (ref 13–39)
Albumin: 4.4 g/dL (ref 4.2–5.5)
Alkaline Phosphatase: 131 U/L — ABNORMAL HIGH (ref 34–104)
BUN: 8 mg/dL (ref 7–25)
Bilirubin, Total: 0.3 mg/dL (ref <=1.4)
Calcium: 8.7 mg/dL (ref 8.6–10.3)
Carbon Dioxide: 21 mmol/L (ref 21–31)
Chloride: 103 mmol/L (ref 98–107)
Creatinine: 0.8 mg/dL (ref 0.7–1.3)
Electrolyte Balance: 13 mmol/L — ABNORMAL HIGH (ref 2–12)
Glucose: 120 mg/dL (ref 85–125)
Potassium: 4 mmol/L (ref 3.5–5.1)
Protein, Total: 7.8 g/dL (ref 6.0–8.3)
Sodium: 137 mmol/L (ref 136–145)
eGFR: 117 mL/min/1.73 sq mtr (ref >=60)

## 2024-04-16 LAB — DIFFERENTIAL - ~~LOC~~
Basophils %: 0.9 % (ref 0.0–2.0)
Basophils Absolute: 0 thous/mcl (ref 0.0–0.2)
Eosinophils %: 1.3 % (ref 0.0–7.0)
Eosinophils Absolute: 0.1 thous/mcl (ref 0.0–0.5)
Lymphocytes %: 36.3 % (ref 14.0–52.0)
Lymphocytes Absolute: 2 thous/mcl (ref 0.9–3.3)
Monocytes %: 5.2 % (ref 1.0–11.0)
Monocytes Absolute: 0.3 thous/mcl (ref 0.0–0.8)
Neutrophils %: 56.3 % (ref 39.0–88.0)
Neutrophils Absolute: 3 thous/mcl (ref 2.0–8.1)

## 2024-04-16 LAB — HEMOGRAM, BLOOD - ~~LOC~~
HCT: 40.3 % (ref 39.5–50.0)
HGB: 13.6 g/dL (ref 13.5–16.9)
MCH: 31.8 pg (ref 27.0–33.5)
MCHC: 33.7 g/dL (ref 32.0–35.5)
MCV: 94.4 fl (ref 81.5–97.0)
MPV: 7.4 fl (ref 7.2–11.7)
PLT Count: 219 thous/mcl (ref 150–400)
RBC: 4.27 mill/mcl — ABNORMAL LOW (ref 4.38–5.62)
RDW-CV: 15.9 % — ABNORMAL HIGH (ref 11.6–14.4)
WBC: 5.4 thous/mcL (ref 4.0–10.5)

## 2024-04-16 LAB — URINALYSIS - ~~LOC~~
Bilirubin: NEGATIVE
Glucose: NEGATIVE mg/dL
Hemoglobin, Urine: NEGATIVE
Ketones, Urine: NEGATIVE mg/dL
Leukocyte Esterase: NEGATIVE
Nitrite: NEGATIVE
Protein: NEGATIVE mg/dL
RBC: <1 /HPF (ref 0–3)
Specific Gravity, Urine: 1.005 (ref 1.003–1.030)
Squamous Epithelial: 0 /LPF (ref 0–10)
Urobilinogen: <2 mg/dL (ref <2)
WBC: 3 /HPF (ref <=5)
pH, Urine: 5.5 (ref 5.0–8.0)

## 2024-04-16 LAB — SALICYLATES, SERUM - ~~LOC~~: Salicylates, Blood: <1 mg/dL — ABNORMAL LOW (ref 6–30)

## 2024-04-16 LAB — ALCOHOL, ETHYL - ~~LOC~~
Alcohol, Ethyl: 365 mg/dL — ABNORMAL HIGH
Alcohol, Ethyl: 220 mg/dL — ABNORMAL HIGH

## 2024-04-16 LAB — ACETAMINOPHEN, BLOOD - ~~LOC~~: Acetaminophen, Blood: <10 ug/mL — ABNORMAL LOW (ref 10.0–30.0)

## 2024-04-16 LAB — LIPASE, BLOOD - ~~LOC~~: Lipase: 52 U/L (ref 11–82)

## 2024-04-16 MED ORDER — LACTATED RINGERS IV BOLUS (~~LOC~~)
1000.0000 mL | Freq: Once | INTRAVENOUS | Status: AC
Start: 2024-04-16 — End: 2024-04-16
  Administered 2024-04-16: 1000 mL via INTRAVENOUS

## 2024-04-16 MED ORDER — FOLIC ACID 5 MG/ML IJ SOLN
1.0000 mg | Freq: Once | INTRAMUSCULAR | Status: AC
Start: 2024-04-16 — End: 2024-04-16
  Administered 2024-04-16: 1 mg via INTRAVENOUS
  Filled 2024-04-16: qty 0.2

## 2024-04-16 MED ORDER — THIAMINE HCL 100 MG/ML IJ SOLN
200.0000 mg | Freq: Every day | INTRAVENOUS | Status: DC
Start: 2024-04-16 — End: 2024-04-17
  Administered 2024-04-16: 200 mg via INTRAVENOUS
  Filled 2024-04-16 (×3): qty 2

## 2024-04-16 MED ORDER — POLY-VI-SOL/IRON 11 MG/ML PO SOLN
1.0000 mL | Freq: Once | ORAL | Status: DC
Start: 2024-04-16 — End: 2024-04-16

## 2024-04-16 MED ORDER — CHLORDIAZEPOXIDE HCL 25 MG OR CAPS
50.0000 mg | ORAL_CAPSULE | ORAL | Status: DC | PRN
Start: 2024-04-16 — End: 2024-04-17

## 2024-04-16 MED ORDER — MULTIVIT/MULTIMINERAL ADULT PO LIQD
15.0000 mL | Freq: Once | ORAL | Status: AC
Start: 2024-04-16 — End: 2024-04-16
  Administered 2024-04-16: 15 mL via ORAL
  Filled 2024-04-16: qty 15

## 2024-04-16 MED ORDER — MAGNESIUM SULFATE 2 GM/50ML IV SOLN
2.0000 g | Freq: Once | INTRAVENOUS | Status: AC
Start: 2024-04-16 — End: 2024-04-16
  Administered 2024-04-16: 2 g via INTRAVENOUS
  Filled 2024-04-16: qty 50

## 2024-04-16 MED ORDER — CHLORDIAZEPOXIDE HCL 25 MG OR CAPS
100.0000 mg | ORAL_CAPSULE | ORAL | Status: DC | PRN
Start: 2024-04-16 — End: 2024-04-17

## 2024-04-16 NOTE — ED Notes (Signed)
 MD Volanda at chairside evaluating patient. PSO nearby

## 2024-04-16 NOTE — ED Notes (Signed)
 Received report from [place], RN at bedside. Patient observed resting in bed with eyes closed. Chest rise and fall observed. VSS, on 2L NC during sleep. No signs of acute distress. Bed locked in lowest position, 2/2 side rails up. Room inspected for safety.

## 2024-04-16 NOTE — Interdisciplinary (Signed)
 Social Work Assessment        Patient Name:  Corey [name]   MRN: [id number]   Date of Birth: 10-09-1970    Age: 53 year old   Date of Admission:  04/16/2024         Service Date: April 16, 2024     Assessment  Assessment Type: Progress/Follow-up;Discharge    Referral Information  Consult Type: Mental Health Assessment   Social Assessment  Where was the patient admitted from? *: Home  Mode of Arrival: Occupational Psychologist) *: Self  Primary Family/Caregiver Contact Name, Number and Relationship *: Corey [name], spouse [phone])  Permission to Contact *: Not Applicable (Chart review)  Is Patient Minor?: No  Interpreter Used?: Not Needed  Living Arrangements on Admission*: Spouse /Significant Other  Post Acute Services Referred To: Inpatient Psych;Other (Comment) (Pending inpatient psych placement)  Post Acute Resources Provided: Behavioral Health;Other (Comment) (Pending inpatient psych placement)  A List of Production Designer, Theatre/television/film Provided: Not Applicable  Available Assistance/Support System *: Spouse / significant other  Type of Residence *: Other (Comment) (N/A)  Home Care Services *: Other (Comment) (Pending inpatient psych placement)  Additional Services on Admission *: Other (Comment) (Pending inpatient psych placement)  Do You Have Transportation Issues/Concerns That Make It Difficult To Get To Your Appointments? *: No  Has discharge transport been arranged?: No  Discharge Transportation Details * : Pending inpatient psych placement  Transportation Company/Phone Number * : Pending inpatient psych placement  Transportation* : Other (Comment) (Pending inpatient psych placement)  Patient Engaged in Discharge Planning *: No  Family/Caregiver's Assessed for *: Not Applicable  Respite Care *: Not Applicable  Patient/Family/Other Are In Agreement With Discharge Plan *: To be determined  Primary Care Access: None     Social Drivers of Health  Living Arrangements on Admission*: Spouse  /Significant Other  Post Acute Services Referred To: Inpatient Psych;Other (Comment) (Pending inpatient psych placement)  Post Acute Resources Provided: Behavioral Health;Other (Comment) (Pending inpatient psych placement)  A List of Production Designer, Theatre/television/film Provided: Not Applicable  Available Assistance/Support System *: Spouse / significant other  Type of Residence *: Other (Comment) (N/A)  Home Care Services *: Other (Comment) (Pending inpatient psych placement)  Additional Services on Admission *: Other (Comment) (Pending inpatient psych placement)  Do You Have Transportation Issues/Concerns That Make It Difficult To Get To Your Appointments? *: No  Has discharge transport been arranged?: No  Discharge Transportation Details * : Pending inpatient psych placement  Transportation Company/Phone Number * : Pending inpatient psych placement  Transportation* : Other (Comment) (Pending inpatient psych placement)  Patient Engaged in Discharge Planning *: No  Family/Caregiver's Assessed for *: Not Applicable  Respite Care *: Not Applicable  Patient/Family/Other Are In Agreement With Discharge Plan *: To be determined  Primary Care Access: None    Income Information  Income Source: Employed    Mental Health Assessment  Past Mental Health Issues: Defer to psych note for information  Mental Status: Other (Comment) (Defer to psych note for information)  Behavioral Assessment: Other (Comment) (Defer to psych note for information)  Physical Assessment: Other (Comment) (Defer to psych note for information)         Referral To  Substance Abuse Referral: Your next level of care provider will manage your addictions treatment  Community Resources: Other (Comment) (Pending inpatient psych placement)    Readmission Risk Assessment  Do You Have Transportation Issues/Concerns That Make It Difficult To Get To Your  Appointments? *: No    Plans/Interventions/Discharge  Plan/Interventions: Other (comment) (Pending inpatient  psych placement)  Anticipated Discharge Destination: Other (Comment) (Pending inpatient psych placement)  Discharge Resources Given: Pending inpatient psych placement  Barriers to Discharge *: Clinical reason;Other (Comment) (Pending inpatient psych placement)    CSW completed mental health assessment for patient via chart review. Patient is a 53 year old, male, brought in by self for suicidal ideation due to command auditory hallucinations. Patient was evaluated by psychiatry and meets criteria to be placed on a voluntary hold. Patient is pending medical clearance to initiate bed search for inpatient psych placement.     Plan: Patient remains in the ED pending medical clearance to initiate bed search for inpatient psych placement.     Corey [name] [place], [place]     Date: 04/16/2024    Time: 7:38 PM

## 2024-04-16 NOTE — Consults (Cosign Needed)
 Note created to link consult order. Please see Dr. Frank note on 04/16/2024 for full consult note.

## 2024-04-16 NOTE — ED Notes (Signed)
 Patient changed out of clothes into new clean gown. All valuables and personal belongings removed and accounted for. Provided sandwich and juice.

## 2024-04-16 NOTE — ED Provider Notes (Signed)
 CHIEF COMPLAINT:  Mental Health Problem (Pt BIB staff from [facility] for SI with plan to run into traffic. Pt is extremely intoxicated, stating he has drank a lot to day it is the only way to cope. Pt called [facility] this week saying he was seeing decapitated bodies in the street. In triage pt states it was real, they were real )     HISTORY OF PRESENT ILLNESS:  Interpreter used:     History of Present Illness  Corey [name] is a 53 year old male with bipolar disorder and alcohol  use disorder who presents with suicidal ideation and hallucinations.    He experiences suicidal ideation, expressing a desire to 'kill myself running in traffic.' He reports an increase in hallucinations and delusions, with voices telling him to harm himself. He has not been taking his psychiatric medications, believing they are ineffective.    He has a history of alcohol  use disorder and reports excessive drinking, though he does not specify the amount. He feels compelled to drink and has experienced shaking and possible seizures related to alcohol  withdrawal in the past.    No current chest or abdominal pain, vomiting, or fever. He denies recent drug use and medication allergies.    Pt brought to ED by [facility], he is new to them. He called them today crying, stating that he was suicidal.                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    Alcohol  intoxication 10/06/2020     SURGICAL HISTORY:  Past Surgical History[2]     ALLERGIES:  Allergies[3]      FAMILY HISTORY:  Reviewed and considered non-contributory     SOCIAL HISTORY/DETERMINANTS OF HEALTH:  none     Socioeconomic History    Marital status: Married   Tobacco Use    Smoking status: Never     Passive exposure: Never    Smokeless tobacco: Never   Substance and Sexual  Activity    Alcohol  use: Yes     Alcohol /week: 10.0 standard drinks of alcohol      Types: 10 Glasses of wine per week     Comment: 10 glasses a day    Drug use: Never      VITAL SIGNS:  First Vitals [04/16/24 1441]   Temperature Heart Rate Respirations Blood pressure (BP) SpO2   97.7 F (36.5 C) 94 18 124/81 97 %     PHYSICAL EXAM:    Physical Exam  Vitals and nursing note reviewed.   Constitutional:       General: He is not in acute distress.     Appearance: Normal appearance. He is not ill-appearing.   HENT:      Head: Normocephalic and atraumatic.      Right Ear: External ear normal.      Left Ear: External ear normal.      Nose: Nose normal. No congestion or rhinorrhea.      Mouth/Throat:      Mouth: Mucous membranes are moist.   Eyes:      General:         Right eye: No discharge.         Left eye: No discharge.      Extraocular Movements: Extraocular movements intact.  Cardiovascular:      Rate and Rhythm: Normal rate and regular rhythm.   Pulmonary:      Effort: Pulmonary effort is normal.      Breath sounds: Normal breath sounds.   Abdominal:      General: Abdomen is flat. There is no distension.      Tenderness: There is no abdominal tenderness. There is no guarding or rebound.   Musculoskeletal:         General: Normal range of motion.      Cervical back: Normal range of motion and neck supple.      Right lower leg: No edema.      Left lower leg: No edema.   Skin:     General: Skin is warm.      Findings: No rash.   Neurological:      General: No focal deficit present.      Mental Status: He is alert. Mental status is at baseline.   Psychiatric:      Comments: Crying in ED  Stating repeatedly that he just wants to be good            MEDICAL DECISION MAKING:    Initial Impressions:  Corey [name] is a 53 year old male who presents with alcohol  intoxication with suicidal ideation.   Vital signs and physical exam were notable for: afebrile, not tachycardic, no hypoxia; emotionally distraught; no  signs of acute withdrawal, no trauma  Differential diagnosis includes: acute psychosis, trauma, self harm, overdose, ingestions, intoxications, metabolic emergencies    Assessment & Plan  Acute suicidal ideation with recent suicide attempt  Expressed hopelessness and desire to die. Non-compliance with psychiatric medications noted.  - Initiated IV access.  - Level 2    Alcohol  use disorder  Excessive alcohol  consumption with symptoms suggestive of withdrawal. Alcohol  use may exacerbate psychiatric symptoms.  - IVF  - Labs  - CIWA  - doubtful withdrawal currently but will monitor    Bipolar disorder with psychotic features (auditory and visual hallucinations, delusions)  Non-compliance with psychiatric medications. Symptoms include auditory and visual hallucinations, delusions, contributing to suicidal ideation.        Workup Review:  Pertinent Lab Results (interpreted independently by me)    The case was discussed with the following service(s): psych   Brief details of consultant discussion(s): Please see ED Course for further details           Disposition Decision:    Psych Placement                                     Patient will benefit from inpatient hospitalization for their psychiatric needs. Medically cleared and pending placement.               The following work up was performed during the encounter. Orders placed after a disposition has been selected will not be listed here. A complete account of orders should be referenced elsewhere:     ED Orders (From admission, onward)      Ordered     Status Ordering Provider    04/16/24 1951  Alcohol , Ethyl  ONCE         Ordered Caidance Sybert KAUSHIK    04/16/24 1649  Precautions: Falls  ONGOING         Acknowledged Ashlyn Cabler KAUSHIK    04/16/24 1649  Precautions: Seizures  ONGOING  Acknowledged Vinayak Bobier KAUSHIK    04/16/24 1649  Notify: Physician  ONGOING        Comments: HOLD chlordiazepoxide  AND lorazepam IF:    * Patient has sudden decrease in  mental status.   * Patient seizing.    Acknowledged Valeria Krisko KAUSHIK    04/16/24 1649  Notify Provider If: Dangerous agitation or CIWA-AR score >15 x3 consecutive hours based on scale.  ONGOING         Acknowledged VOLANDA DANIELS [facility]    04/16/24 1649  Nurse Assess: CIWA Algorithm  NOW THEN EVERY 4 HOURS         Acknowledged VOLANDA DANIELS [facility]    04/16/24 1649  Notify Provider If: If/when CIWA-AR score < 10 x 72 hours  ONGOING         Acknowledged Govind Furey KAUSHIK    04/16/24 1649  chlordiazePOXIDE  (LIBRIUM ) capsule 50 mg  EVERY 1 HOUR PRN        Placed in Or Linked Group    Acknowledged Teruko Joswick KAUSHIK    04/16/24 1649  chlordiazePOXIDE  (LIBRIUM ) capsule 100 mg  EVERY 1 HOUR PRN        Placed in Or Linked Group    Acknowledged VOLANDA DANIELS [facility]    04/16/24 1647  Voluntary but Holdable  ONGOING         Acknowledged LE, BRITTANY BAO-HAN    04/16/24 1505  Hemogram  PROCEDURE ONCE         Final result VOLANDA DANIELS [facility]    04/16/24 1505  Differential  PROCEDURE ONCE         Final result VOLANDA DANIELS [facility]    04/16/24 1617  Nursing Misc Order: ok to downgrade to moderate risk  ONGOING         Acknowledged LE, BRITTANY BAO-HAN    04/16/24 1519  Multivit/Multimineral Adult LIQD 15 mL  ONCE         Last MAR action: Given - by EVERN GUN on 04/16/24 at 1659 VOLANDA DANIELS [facility]    04/16/24 1505  lactated ringers  bolus 1,000 mL  ONCE         Last MAR action: New Bag - by VONZELL ROVER on 04/16/24 at 1627 Davin Muramoto KAUSHIK    04/16/24 1505  magnesium  sulfate 2 GM/50ML IVPB 2 g  ONCE         Last MAR action: Completed - by RODRIGUEZ, MONIQUE on 04/16/24 at 1900 Jaquann Guarisco KAUSHIK    04/16/24 1505  thiamine  (VITAMIN B1) 200 mg in sodium chloride 0.9 % 50 mL IVPB  DAILY         Last MAR action: New Bag - by EVERN GUN on 04/16/24 at 1903 Omere Marti KAUSHIK    04/16/24 1505  folic acid  injection 1 mg  ONCE         Last MAR action: Given - by EVERN GUN on  04/16/24 at 1901 Toryn Dewalt KAUSHIK    04/16/24 1504  Denial of Rights  ONGOING         Acknowledged VOLANDA DANIELS [facility]    04/16/24 1504  IP Consult to Psychiatry  ONE TIME        Provider:  (Not yet assigned)    Completed by LADORA LAYMON BEGAN on 04/16/2024 at  5:37 PM VOLANDA DANIELS KAUSHIK    04/16/24 1504  IP Consult to Social Work  ONE TIME         Completed by IZELLA, AN on 04/16/2024 at  7:40 PM VOLANDA DANIELS KAUSHIK    04/16/24 1504  Urinalysis  ONCE         Final result VOLANDA DANIELS [facility]    04/16/24 1504  Drug Screen Rapid Panel 12 No Confirmation, Urine  ONCE         Final result Symphonie Schneiderman KAUSHIK    04/16/24 1505  CBC w/ Diff  ONCE         Final result VOLANDA DANIELS [facility]    04/16/24 1505  Comprehensive Metabolic Panel  ONCE         Final result Sheriden Archibeque KAUSHIK    04/16/24 1505  Lipase, Blood  ONCE         Final result Chantal Worthey KAUSHIK    04/16/24 1505  Acetaminophen , Blood  ONCE         Final result Olanda Boughner KAUSHIK    04/16/24 1505  Salicylates, Serum  ONCE         Final result Takeisha Cianci KAUSHIK    04/16/24 1505  Alcohol , Ethyl  ONCE         Final result VOLANDA DANIELS PICKING             ED COURSE/PATIENT REASSESSMENTS:  ED Course is listed below that provides real time documentation during ER encounter and will provide further context to the MDM discussion above.    Workup Summary         Value Comment By Time      Psych to see pt.  Volanda Daniels Picking, MD 12/05 [phone]      Psych keeping pt as voluntary, they will keep monitoring, requesting CIWA protocol. Volanda Daniels Picking, MD 12/05 1649      Alcohol : 365 (Reviewed) Volanda Daniels Picking, MD 12/05 [phone]      Psych continuing to monitor pt. He is sleeping. No signs of withdrawal. At this time he is medically cleared for psych to reevaluate.  Volanda Daniels Picking, MD 12/05 1950      Pending Placement to Psych Facility: 53 year old male with SI on a level2  -Medically stable for psych evaluation: yes  -Psych Meds  Ordered: yes  -Required emergency meds: yes  -Medical Conditions: AUD    -Chronic Meds Ordered: no    -Additional Consults: no    -Needs 2nd Medical Evaluation for Stability? no     Volanda Daniels Picking, MD 12/05 1950              DIAGNOSIS:    ICD-10-CM ICD-9-CM   1. Suicidal ideations  R45.851 V62.84   2. Alcohol  use disorder  F10.90 V49.89                             [1]   Past Medical History:  Diagnosis Date    Alcohol  abuse     Hypertension    [2] No past surgical history on file.  [3] No Known Allergies       Volanda Daniels Picking, MD  04/16/24 [phone]
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
